# Patient Record
Sex: Female | Born: 1960 | Race: White | Hispanic: No | Marital: Married | Smoking: Former smoker
Health system: Southern US, Community
[De-identification: ages and names within clinical notes are randomized; demographics above are authoritative.]

## PROBLEM LIST (undated history)

## (undated) DIAGNOSIS — C801 Malignant (primary) neoplasm, unspecified: Secondary | ICD-10-CM

## (undated) DIAGNOSIS — M1990 Unspecified osteoarthritis, unspecified site: Secondary | ICD-10-CM

## (undated) DIAGNOSIS — N189 Chronic kidney disease, unspecified: Secondary | ICD-10-CM

## (undated) DIAGNOSIS — J302 Other seasonal allergic rhinitis: Secondary | ICD-10-CM

## (undated) DIAGNOSIS — E119 Type 2 diabetes mellitus without complications: Secondary | ICD-10-CM

## (undated) DIAGNOSIS — Z8489 Family history of other specified conditions: Secondary | ICD-10-CM

## (undated) DIAGNOSIS — E039 Hypothyroidism, unspecified: Secondary | ICD-10-CM

## (undated) DIAGNOSIS — Z9884 Bariatric surgery status: Secondary | ICD-10-CM

## (undated) HISTORY — PX: TOTAL THYROIDECTOMY: SHX2547

## (undated) HISTORY — PX: DIAGNOSTIC LAPAROSCOPY: SUR761

## (undated) HISTORY — PX: THUMB ARTHROSCOPY: SHX2509

## (undated) HISTORY — PX: CHOLECYSTECTOMY: SHX55

## (undated) HISTORY — PX: ROTATOR CUFF REPAIR: SHX139

---

## 2000-05-18 HISTORY — PX: GASTRIC BYPASS: SHX52

## 2004-07-17 ENCOUNTER — Other Ambulatory Visit: Payer: Self-pay

## 2004-07-17 ENCOUNTER — Emergency Department: Payer: Self-pay | Admitting: Unknown Physician Specialty

## 2004-07-23 ENCOUNTER — Ambulatory Visit: Payer: Self-pay | Admitting: Family Medicine

## 2004-08-07 ENCOUNTER — Ambulatory Visit: Payer: Self-pay | Admitting: General Surgery

## 2005-03-23 ENCOUNTER — Ambulatory Visit: Payer: Self-pay | Admitting: Otolaryngology

## 2005-03-26 ENCOUNTER — Ambulatory Visit: Payer: Self-pay | Admitting: Otolaryngology

## 2005-05-13 ENCOUNTER — Ambulatory Visit: Payer: Self-pay | Admitting: Otolaryngology

## 2005-06-24 ENCOUNTER — Ambulatory Visit: Payer: Self-pay | Admitting: Otolaryngology

## 2005-07-13 ENCOUNTER — Ambulatory Visit: Payer: Self-pay | Admitting: Otolaryngology

## 2005-07-17 ENCOUNTER — Ambulatory Visit: Payer: Self-pay | Admitting: Otolaryngology

## 2006-11-17 ENCOUNTER — Ambulatory Visit: Payer: Self-pay | Admitting: Orthopaedic Surgery

## 2007-05-10 ENCOUNTER — Encounter: Admission: RE | Admit: 2007-05-10 | Discharge: 2007-05-10 | Payer: Self-pay | Admitting: Gastroenterology

## 2007-08-30 ENCOUNTER — Encounter: Payer: Self-pay | Admitting: Family Medicine

## 2007-09-16 ENCOUNTER — Encounter: Payer: Self-pay | Admitting: Family Medicine

## 2007-10-17 ENCOUNTER — Encounter: Payer: Self-pay | Admitting: Family Medicine

## 2007-11-01 ENCOUNTER — Ambulatory Visit: Payer: Self-pay | Admitting: Urology

## 2008-01-04 ENCOUNTER — Ambulatory Visit: Payer: Self-pay | Admitting: General Surgery

## 2008-01-06 ENCOUNTER — Ambulatory Visit: Payer: Self-pay | Admitting: General Surgery

## 2008-01-26 ENCOUNTER — Other Ambulatory Visit: Payer: Self-pay

## 2008-01-26 ENCOUNTER — Emergency Department: Payer: Self-pay | Admitting: Emergency Medicine

## 2008-07-19 ENCOUNTER — Ambulatory Visit: Payer: Self-pay | Admitting: Family Medicine

## 2010-02-18 ENCOUNTER — Inpatient Hospital Stay: Payer: Self-pay | Admitting: Psychiatry

## 2010-12-29 ENCOUNTER — Ambulatory Visit: Payer: Self-pay | Admitting: Sports Medicine

## 2011-02-20 ENCOUNTER — Ambulatory Visit: Payer: Self-pay | Admitting: Orthopedic Surgery

## 2011-02-26 ENCOUNTER — Ambulatory Visit: Payer: Self-pay | Admitting: Orthopedic Surgery

## 2013-04-21 ENCOUNTER — Ambulatory Visit: Payer: Self-pay | Admitting: Specialist

## 2013-08-08 ENCOUNTER — Encounter: Payer: Self-pay | Admitting: Specialist

## 2013-08-16 ENCOUNTER — Encounter: Payer: Self-pay | Admitting: Specialist

## 2013-09-15 ENCOUNTER — Encounter: Payer: Self-pay | Admitting: Specialist

## 2014-09-07 NOTE — Op Note (Signed)
PATIENT NAME:  York, Megan MR#:  [id number] DATE OF BIRTH:  1960-11-13  DATE OF PROCEDURE:  04/21/2013  PREOPERATIVE DIAGNOSIS: Severe degenerative arthritis first metacarpocarpal joint, right thumb.   POSTOPERATIVE DIAGNOSIS: Severe degenerative arthritis first metacarpocarpal joint, right thumb.   PROCEDURE PERFORMED: Excisional trapezium arthroplasty, right thumb.   SURGEON: Christophe Louis, M.D.   ANESTHESIA: General.   COMPLICATIONS: None.   TOURNIQUET TIME: Approximately 60 minutes.   DESCRIPTION OF PROCEDURE: One gram of vancomycin was given intravenously prior to the procedure. General anesthesia is induced. The right upper extremity is thoroughly prepped with alcohol and ChloraPrep and draped in standard sterile fashion. Under loupe magnification, a standard dorsal incision is made and the dissection carried down to the dorsal capsule with reflection of cutaneous nerves and the tendons to the thumb and held with a spring retractor. Longitudinal incision is then made in the dorsal capsule and this is carefully reflected off the base of the first metacarpal and the trapezium for resuture at the end of the case. The trapezium is completely dissected out. It is then morselized using the saw and then completely removed using the rongeur. The wound is thoroughly irrigated multiple times. 0.5% plain Marcaine is used for a radial nerve block and also a median nerve block. Three small pieces of Gelfoam are then compressed into the shape of a trapezium and sewn together using 4-0 Mersilene. This is then secured to the base of the wound through the tendon at the base using the same 4-0 Mersilene suture. Dorsal capsule is then repaired securely using 4-0 Mersilene. One subcutaneous 5-0 Vicryl suture is placed and then a running subcuticular 3-0 Prolene. A soft bulky dressing with a thumb spica splint is applied. The tourniquet is released, and the patient is returned to the recovery room in  satisfactory condition having tolerated the procedure quite well. ____________________________ Lucas Mallow, MD ces:sb D: 04/21/2013 11:02:03 ET T: 04/21/2013 11:58:16 ET JOB#: [id number]  cc: Lucas Mallow, MD, <Dictator> Lucas Mallow MD ELECTRONICALLY SIGNED 04/24/2013 8:59

## 2015-10-24 ENCOUNTER — Encounter: Payer: Self-pay | Admitting: Physical Therapy

## 2015-10-24 ENCOUNTER — Ambulatory Visit: Payer: 59 | Attending: Orthopedic Surgery | Admitting: Physical Therapy

## 2015-10-24 DIAGNOSIS — M25612 Stiffness of left shoulder, not elsewhere classified: Secondary | ICD-10-CM | POA: Insufficient documentation

## 2015-10-24 DIAGNOSIS — M25512 Pain in left shoulder: Secondary | ICD-10-CM | POA: Insufficient documentation

## 2015-10-24 DIAGNOSIS — M6281 Muscle weakness (generalized): Secondary | ICD-10-CM | POA: Diagnosis present

## 2015-10-24 NOTE — Therapy (Signed)
[facility] [address] [place], [address] Phone: [phone]   Fax:  [phone]  Physical Therapy Evaluation  Patient Details  Name: Megan York MRN: [id number] Date of Birth: August 25, 1960 Referring Provider: Lauris Poag, MD  Encounter Date: 10/24/2015      PT End of Session - 10/24/15 1936    Visit Number 1   Number of Visits 12   Date for PT Re-Evaluation 12/05/15   PT Start Time 1815   PT Stop Time 1920   PT Time Calculation (min) 65 min   Activity Tolerance Patient tolerated treatment well;Patient limited by pain   Behavior During Therapy [facility] for tasks assessed/performed      History reviewed. No pertinent past medical history.  History reviewed. No pertinent past surgical history.  There were no vitals filed for this visit.       Subjective Assessment - 10/24/15 1818    Subjective Increased shoulder pain over lateral aspect of the shoulder onset ~2 month ago when pushing her grandchildren at the playground; states she had increased aggravation of pain during the movement and the next day. Patient reports worsening neck pain with onset of shoulder pain. Pain is aggravated when lifting arm, lowering arm from raised position, reaching, washing her hair, and putting arm behind her back. Alleviation of symptoms with medication and "shaking out" the shoulder.  Patient reports pain is aggravated with increased activity.    Pertinent History History of thyroid cancer (2007) resulting in thyroidectomy and remission; (2) previous rotator cuff surgeries bilaterally;    Patient Stated Goals Decrease shoulder pain and avoiding surgery.    Currently in Pain? Yes   Pain Score 2    Pain Location Shoulder   Pain Orientation Left   Pain Descriptors / Indicators Aching;Burning;Stabbing   Pain Type Acute pain   Pain Onset More than a month ago            [facility] PT Assessment - 10/24/15 1812     Assessment   Medical Diagnosis Impingement syndrome of shoulder, left (M75.42)   Referring Provider Lauris Poag, MD   Onset Date/Surgical Date 08/17/15   Hand Dominance Right   Next MD Visit unknown   Prior Therapy none   Precautions   Precautions None   Restrictions   Weight Bearing Restrictions No   Balance Screen   Has the patient fallen in the past 6 months No   Has the patient had a decrease in activity level because of a fear of falling?  No   Is the patient reluctant to leave their home because of a fear of falling?  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 Palpation: Increased tenderness and guarding along the upper trap, supraspinatus, infraspinatus, latissimus dorsi, and pec major.   Measurement:  Nerve Exam: Sensation: C3:Decreased sensation on L,  C4:Decreased sensation on L, C5: Decreased sensation on L, C6: WNL on both sides, C7:  WNL on both sides, C8: WNL on both sides, T1: WNL on both sides; MMT: C3: 5/5 , C4:4/5 -- increased pain, C5:3+/5 increased pain, C6:4/5 (elbow flexion with increased pain), C7:5/5, C8:5/5, T1:5/5; Reflexes: C5: WNL C6: WNL  Shoulder AROM/MMT: Flexion: 135 3+/5, ABD: 90 3+/5, ER:70 4/5 , IR 90 5/5, Behind Back: Ipslateral hip --increased pain, Behind Head: back of head --increased pain  Special Testing: (+): Hawkins kennedy  positive, Neer's, positive painful arc, cross arm adduction test, empty can (-): Full can, Spurlings  Outcome Measures: QuickDASH: 30% (self-perceived moderate shoulder dysfunction)  Therapeutic Exercise: Patient performed exercises with guidance, verbal and tactile cues and demonstration of therapist: Scapular retraction in sitting -- x10 Shoulder flexion in supine to forehead -- x10   Educated on proper postural alignment and to perform impingement precautions to not perform overhead motions, reaching across body, and abduction movements.  Manual Therapy:  Soft tissue mobilizations in supine along upper traps, pec major, and infraspinatus to decrease pain and spasms utilizing superficial techniques.  Modalities: Electrical stimulation: High volt clinical protocol applied (2) large electrodes over shoulder complex to decrease muscular pain and spasms with patient positioned in sitting. [place] stimulation with (2) large electrodes 10 sec on 10 sec off applied to scapular retractors to facilitate muscular activation simultaneously with high volt. Ice pack applied to shoulder in conjunction with electrical stimulation: no adverse reaction noted   Patient response to treatment: Decreased pain and spasms after performing modalities treatment indicating decreased muscular guarding. Decreased scapular retraction AROM which is improved after applying tactile cueing indicating decreased motor control.            PT Education - 10/24/15 1933    Education provided Yes   Education Details Educated on shoulder impingement precautions. Shoulder overhead AROM, scapular retractions   Person(s) Educated Patient   Methods Explanation;Demonstration   Comprehension Verbalized understanding;Returned demonstration             PT Long Term Goals - 10/24/15 1947    PT LONG TERM GOAL #1   Title Patient will demonstrate a QuickDASH score of under 20% by 12/05/15 to show significant improvement in  shoulder function and ability to raise her arm overhead   Baseline QuickDASH: 30%   Status New   PT LONG TERM GOAL #2   Title Patient will be able to perform shoulder flexion of 160 degrees without onset of symptoms by 12/05/15 to show sigificant improvement in shoulder function and ability to perform reaching activities.   Baseline Increased pain at 130 of shoulder flexion   Status New   PT LONG TERM GOAL #3   Title Patient will be independent with exercise performance and progression focused on improving shoulder strength and endurance by 12/05/15 to allow for further benefits of therapy after discharge from therapy   Baseline dependent for exercise performance and progression   Status New               Plan - 10/24/15 1937    Clinical Impression Statement Patient is a 55 yo right hand dominant female experiencing onset of left shoulder pain after playing with her grandchildren at the playground. Patient demonstrates shoulder dysfunction indicated by a decreased QuickDASH score of 30% (moderate self perceived shoulder dysfunction), Increased pain with shoulder flexion and  eccentric lowering her shoulder, and increased pain with reaching activities. Patient presents with possible shoulder impingement and supraspinatus involvement  as indicated by positive empty can test (with a negative full can test), positive hawkins-kennedy, neers, and cross arm  abduction test. Patient will benefit from further therapy intervention focused on decreasing pain/spasms and improving shoulder muscle endurance, coordination, and strength to return to prior level of function.     Rehab Potential Good   Clinical Impairments Affecting Rehab Potential (+) highly motivated, family support; (-) job requires use of UE, diabetes   PT Frequency 2x / week   PT Duration 6 weeks   PT Treatment/Interventions Cryotherapy;Electrical Stimulation;Iontophoresis 4mg /ml Dexamethasone;Moist Heat;Therapeutic exercise;Therapeutic  activities;Ultrasound;Neuromuscular re-education;Patient/family education;Manual techniques;Passive range of motion   PT Next Visit Plan Progress with performance of exercises in a pain-free range, scapular stabilization; pain control   PT Home Exercise Plan Overhead shoulder flexion, scapular retraction; observe shoulder impingement precautions   Consulted and Agree with Plan of Care Patient      Patient will benefit from skilled therapeutic intervention in order to improve the following deficits and impairments:  Decreased activity tolerance, Decreased mobility, Decreased strength, Increased edema, Impaired flexibility, Pain, Impaired UE functional use, Increased muscle spasms, Decreased endurance, Decreased range of motion  Visit Diagnosis: Stiffness of left shoulder, not elsewhere classified  Pain in left shoulder  Muscle weakness (generalized)     Problem List There are no active problems to display for this patient.   Blythe Stanford, SPT 10/25/2015, 12:00 PM  [place] [facility] [address], [place], [address] Phone: [phone]   Fax:  [phone]  Name: Megan York MRN: [id number] Date of Birth: 06-19-1960

## 2015-10-28 ENCOUNTER — Ambulatory Visit: Payer: 59 | Admitting: Physical Therapy

## 2015-10-29 ENCOUNTER — Encounter: Payer: Self-pay | Admitting: Physical Therapy

## 2015-10-29 ENCOUNTER — Ambulatory Visit: Payer: 59 | Admitting: Physical Therapy

## 2015-10-29 DIAGNOSIS — M25612 Stiffness of left shoulder, not elsewhere classified: Secondary | ICD-10-CM

## 2015-10-29 DIAGNOSIS — M6281 Muscle weakness (generalized): Secondary | ICD-10-CM

## 2015-10-29 DIAGNOSIS — M25512 Pain in left shoulder: Secondary | ICD-10-CM

## 2015-10-29 NOTE — Therapy (Signed)
[place] [facility] [address] [place], [address] Phone: [phone]   Fax:  [phone]  Physical Therapy Treatment  Patient Details  Name: Megan York MRN: [id number] Date of Birth: 1960-06-19 Referring Provider: Lauris Poag, MD  Encounter Date: 10/29/2015      PT End of Session - 10/29/15 1736    Visit Number 2   Number of Visits 12   Date for PT Re-Evaluation 12/05/15   PT Start Time [id number]   PT Stop Time 1826   PT Time Calculation (min) 53 min   Activity Tolerance Patient tolerated treatment well;Patient limited by pain   Behavior During Therapy [facility] for tasks assessed/performed      History reviewed. No pertinent past medical history.  History reviewed. No pertinent past surgical history.  There were no vitals filed for this visit.      Subjective Assessment - 10/29/15 1734    Subjective Patient reports she has been hurting a lot over the past few days due ot pain. Feels like a toothache. She has been trying to obey precautions however she uses her left arm a lot.    Patient Stated Goals Decrease shoulder pain and avoiding surgery.    Currently in Pain? Yes   Pain Score 4    Pain Location Shoulder   Pain Orientation Left   Pain Descriptors / Indicators Aching;Burning;Stabbing   Pain Type Acute pain   Pain Onset More than a month ago      Objective: AROM; left shoulder forward elevation + pain above shoulder level Palpation: left upper back, trapezius muscle: + tenderness, spasms    Treatment: Manual therapy: STM performed with superficial techniques left upper back, trapezius muscle, patient seated    Exercise:patient performed exercises with guidance, verbal and tactile cues and demonstration of PT: Supine lying: AAROM flexion left shoulder x 10, isometric flexion at 90 degrees, ER/IR in scapular plane 10 reps each rhythmic stabilization technique Supine lying: scapular  retraction with mild resistance x 10 reps Sitting: scapular retraction with tactile cues x 10 reps    Modalities: Iontophoresis: with dexamethasone 4mg /ml applied (2) electrodes to left shoulder, superior and anterior aspect 66ma-min (20 min. Following application)  Patient response to treatment: no adverse reaction to iontophoresis noted, patient reported less pain and improved flexibility in upper back following treatment of manual therapy/exercise. Improved ROM and ability to perform scapular retraction with repetition and cuing        PT Long Term Goals - 10/24/15 1947    PT LONG TERM GOAL #1   Title Patient will demonstrate a QuickDASH score of under 20% by 12/05/15 to show significant improvement in shoulder function and ability to raise her arm overhead   Baseline QuickDASH: 30%   Status New   PT LONG TERM GOAL #2   Title Patient will be able to perform shoulder flexion of 160 degrees without onset of symptoms by 12/05/15 to show sigificant improvement in shoulder function and ability to perform reaching activities.   Baseline Increased pain at 130 of shoulder flexion   Status New   PT LONG TERM GOAL #3   Title Patient will be independent with exercise performance and progression focused on improving shoulder strength and endurance by 12/05/15 to allow for further benefits of therapy after discharge from therapy   Baseline dependent for exercise performance and progression   Status New  Plan - 10/29/15 1839    Clinical Impression Statement Patient demonstrated decresaed pain in left shoulder and decreased spasms following treatment today. She continues with pain as primary limitaiton for normal function without difficulty using left UE and will therefore benefit from continue physical therapy intervention to address limitaitons and achieve goals.    Rehab Potential Good   PT Frequency 2x / week   PT Duration 6 weeks   PT Treatment/Interventions  Cryotherapy;Electrical Stimulation;Iontophoresis 4mg /ml Dexamethasone;Moist Heat;Therapeutic exercise;Therapeutic activities;Ultrasound;Neuromuscular re-education;Patient/family education;Manual techniques;Passive range of motion   PT Next Visit Plan Progress with performance of exercises in a pain-free range, scapular stabilization; iontophoresis with dexamethasone 4mg /ml   PT Home Exercise Plan Overhead shoulder flexion, scapular retraction      Patient will benefit from skilled therapeutic intervention in order to improve the following deficits and impairments:  Decreased activity tolerance, Decreased mobility, Decreased strength, Increased edema, Impaired flexibility, Pain, Impaired UE functional use, Increased muscle spasms, Decreased endurance, Decreased range of motion  Visit Diagnosis: Stiffness of left shoulder, not elsewhere classified  Pain in left shoulder  Muscle weakness (generalized)     Problem List There are no active problems to display for this patient.   Jomarie Longs PT 10/30/2015, 5:21 PM  [place] [facility] [address] [place], [address] Phone: [phone]   Fax:  [phone]  Name: Ainsleigh Drohan MRN: [id number] Date of Birth: Jun 21, 1960

## 2015-10-30 ENCOUNTER — Encounter: Payer: 59 | Admitting: Physical Therapy

## 2015-10-31 ENCOUNTER — Encounter: Payer: Self-pay | Admitting: Physical Therapy

## 2015-10-31 ENCOUNTER — Ambulatory Visit: Payer: 59 | Admitting: Physical Therapy

## 2015-10-31 DIAGNOSIS — M25612 Stiffness of left shoulder, not elsewhere classified: Secondary | ICD-10-CM | POA: Diagnosis not present

## 2015-10-31 DIAGNOSIS — M6281 Muscle weakness (generalized): Secondary | ICD-10-CM

## 2015-10-31 DIAGNOSIS — M25512 Pain in left shoulder: Secondary | ICD-10-CM

## 2015-10-31 NOTE — Therapy (Signed)
[place] [facility] [address], [place], [address] Phone: [phone]   Fax:  [phone]  Physical Therapy Treatment  Patient Details  Name: Megan York MRN: [id number] Date of Birth: December 20, 1960 Referring Provider: Lauris Poag, MD  Encounter Date: 10/31/2015      PT End of Session - 10/31/15 1859    Visit Number 3   Number of Visits 12   Date for PT Re-Evaluation 12/05/15   PT Start Time 1850   PT Stop Time 1945   PT Time Calculation (min) 55 min   Activity Tolerance Patient tolerated treatment well;Patient limited by pain   Behavior During Therapy [facility] for tasks assessed/performed      History reviewed. No pertinent past medical history.  History reviewed. No pertinent past surgical history.  There were no vitals filed for this visit.      Subjective Assessment - 10/31/15 1854    Subjective Patient reports no adverse reactions noted to previous treatment of iontophoresis. She has been trying to observe impingement precautions and is noticing minimal, if any improvement at this time.    Limitations Lifting;House hold activities;Other (comment)  work related activites: reaching, lifting   Patient Stated Goals Decrease shoulder pain and avoiding surgery.    Currently in Pain? Yes   Pain Score 1    Pain Location Shoulder   Pain Orientation Left   Pain Descriptors / Indicators Aching;Tightness  intermittent sharp pain in shoulder with using left UE   Pain Type Acute pain   Pain Onset More than a month ago  09/2015   Pain Frequency Other (Comment)  aching constant/ sharp pain intermitently   Aggravating Factors  reaching, lifting, crossing arm in front of her   Pain Relieving Factors rest, ice   Effect of Pain on Daily Activities unable to perform activties without pain/difficulty        Objective: AROM; left shoulder forward elevation + pain above shoulder level Palpation: left  upper back, trapezius muscle: + tenderness, spasms (significantly decreased from previous session), mild tenderness superior aspect of left shoulder over [facility] joint   Treatment:   Exercise:patient performed exercises with guidance, verbal and tactile cues and demonstration of PT: Standing at door: Scapular retraction x 10 reps 2 second holds Isometric shoulder flexion/ER/IR with towel with cuing for correct positioning of shoulder/trunk and LE's to perform with correct muscle activation and alignment 5 reps x 5 second holds Instructed in scapular control with shoulder back and down using UE ranger (on floor with patient standing) for shoulder flexion x 1 min. And rotations with towel under arm x 1 min. (compared to right UE for patient to feel the difference in motor control/strength)  Modalities: Iontophoresis: with dexamethasone 4mg /ml applied (2) electrodes to left shoulder, superior and anterior aspect 16ma-min (20 min. Following application)  Patient response to treatment: no adverse reaction to iontophoresis noted, patient reported less pain and improved flexibility in left shoulder.  Improved ROM and ability to perform scapular retraction with repetition and cuing using UE ranger        PT Education - 10/31/15 2015    Education provided Yes   Education Details HEP: added isometric exercises for left shoulder, instructed in correct motor control for scapula during all exercises   Person(s) Educated Patient   Methods Explanation;Demonstration;Tactile cues;Verbal cues;Handout   Comprehension Verbalized understanding;Returned demonstration;Verbal cues required             PT Long Term Goals -  10/24/15 1947    PT LONG TERM GOAL #1   Title Patient will demonstrate a QuickDASH score of under 20% by 12/05/15 to show significant improvement in shoulder function and ability to raise her arm overhead   Baseline QuickDASH: 30%   Status New   PT LONG TERM GOAL #2   Title Patient will  be able to perform shoulder flexion of 160 degrees without onset of symptoms by 12/05/15 to show sigificant improvement in shoulder function and ability to perform reaching activities.   Baseline Increased pain at 130 of shoulder flexion   Status New   PT LONG TERM GOAL #3   Title Patient will be independent with exercise performance and progression focused on improving shoulder strength and endurance by 12/05/15 to allow for further benefits of therapy after discharge from therapy   Baseline dependent for exercise performance and progression   Status New               Plan - 10/31/15 2000    Clinical Impression Statement Patient demonstrated good carry over with iontophoresis with decreased pain from 4/10 to 1/10. She continues with pain in left shoulder and decreased motor control in periscapular muscles which will benefit from additional physical therapy intervention.     Rehab Potential Good   PT Frequency 2x / week   PT Duration 6 weeks   PT Treatment/Interventions Cryotherapy;Electrical Stimulation;Iontophoresis 4mg /ml Dexamethasone;Moist Heat;Therapeutic exercise;Therapeutic activities;Ultrasound;Neuromuscular re-education;Patient/family education;Manual techniques;Passive range of motion   PT Next Visit Plan Progress with performance of exercises in a pain-free range, scapular stabilization; iontophoresis with dexamethasone 4mg /ml   PT Home Exercise Plan isometric shoulder flexion and rotations at doorway, scapular control/retraction in doorway      Patient will benefit from skilled therapeutic intervention in order to improve the following deficits and impairments:  Decreased activity tolerance, Decreased mobility, Decreased strength, Increased edema, Impaired flexibility, Pain, Impaired UE functional use, Increased muscle spasms, Decreased endurance, Decreased range of motion  Visit Diagnosis: Stiffness of left shoulder, not elsewhere classified  Pain in left  shoulder  Muscle weakness (generalized)     Problem List There are no active problems to display for this patient.   Jomarie Longs PT 10/31/2015, 8:16 PM  [place] [facility] [address] [place], [address] Phone: [phone]   Fax:  [phone]  Name: Dannaly Arai MRN: [id number] Date of Birth: July 28, 1960

## 2015-11-05 ENCOUNTER — Ambulatory Visit: Payer: 59 | Admitting: Physical Therapy

## 2015-11-05 ENCOUNTER — Encounter: Payer: Self-pay | Admitting: Physical Therapy

## 2015-11-05 DIAGNOSIS — M25512 Pain in left shoulder: Secondary | ICD-10-CM

## 2015-11-05 DIAGNOSIS — M25612 Stiffness of left shoulder, not elsewhere classified: Secondary | ICD-10-CM | POA: Diagnosis not present

## 2015-11-05 DIAGNOSIS — M6281 Muscle weakness (generalized): Secondary | ICD-10-CM

## 2015-11-05 NOTE — Therapy (Signed)
[facility] [address] [place], [address] Phone: [phone]   Fax:  [phone]  Physical Therapy Treatment  Patient Details  Name: Megan York MRN: [id number] Date of Birth: June 01, 1960 Referring Provider: Lauris Poag, MD  Encounter Date: 11/05/2015      PT End of Session - 11/05/15 1739    Visit Number 4   Number of Visits 12   Date for PT Re-Evaluation 12/05/15   PT Start Time 1801   PT Stop Time 1856   PT Time Calculation (min) 55 min   Activity Tolerance Patient tolerated treatment well;Patient limited by pain   Behavior During Therapy [facility] for tasks assessed/performed      History reviewed. No pertinent past medical history.  History reviewed. No pertinent past surgical history.  There were no vitals filed for this visit.      Subjective Assessment - 11/05/15 1705    Subjective Patient reports increased weakness and pain during and after exercise performance. Patient states symptoms are minorly improved after the iontophoresis treatment the previous visit.   Limitations Lifting;House hold activities;Other (comment)  work related activites: reaching, lifting   Patient Stated Goals Decrease shoulder pain and avoiding surgery.    Currently in Pain? Yes   Pain Score 1    Pain Location Shoulder   Pain Orientation Left   Pain Descriptors / Indicators Aching;Tightness   Pain Type Acute pain   Pain Onset More than a month ago  09/2015        Objective: AROM; left shoulder forward elevation + pain above shoulder level Palpation: left upper back, trapezius muscle: + tenderness, spasms (significantly decreased from previous session), increased spasms along external shoulder rotators  Special Tests: Shoulder AROM: 160 with pain at 100 deg [place], neers, drop arm, empty can,cross arm abduction test -- (+)  Treatment:  Exercise: patient performed exercises with  guidance, verbal and tactile cues and demonstration of PT:  Seated: With back to pulley: Shoulder AAROM with pulley over doorway-- 3 min  Standing at door: Standing pulley arm rotations -- 3 min Scapular retraction x 10 reps 2 second holds Isometric shoulder flexion/ER/IR with towel with cuing for correct positioning of shoulder/trunk and LE's to perform with correct muscle activation and alignment -- 3 reps x 5 second holds   Modalities: Iontophoresis: with dexamethasone 4mg /ml applied (2) electrodes to left shoulder, posterior and anterior aspect 39ma-min (25 min. Following application)  Patient response to treatment: no adverse reaction to iontophoresis noted, patient reported less  in left irritation in left shoulder. Good demonstration of pulley exercise requiring minimal cueing to decrease shoulder activation. Increased pain with isometric ER at doorway therefore modified with decreasing repetition from 5 to 3 reps         PT Education - 11/05/15 1740    Education provided Yes   Education Details HEP: Pulleys in sitting and in standing   Person(s) Educated Patient   Methods Explanation;Demonstration   Comprehension Verbalized understanding;Returned demonstration             PT Long Term Goals - 10/24/15 1947    PT LONG TERM GOAL #1   Title Patient will demonstrate a QuickDASH score of under 20% by 12/05/15 to show significant improvement in shoulder function and ability to raise her arm overhead   Baseline QuickDASH: 30%   Status New   PT LONG TERM GOAL #2   Title Patient will be able to perform shoulder flexion  of 160 degrees without onset of symptoms by 12/05/15 to show sigificant improvement in shoulder function and ability to perform reaching activities.   Baseline Increased pain at 130 of shoulder flexion   Status New   PT LONG TERM GOAL #3   Title Patient will be independent with exercise performance and progression focused on improving shoulder strength and  endurance by 12/05/15 to allow for further benefits of therapy after discharge from therapy   Baseline dependent for exercise performance and progression   Status New               Plan - 11/05/15 1731    Clinical Impression Statement Patient demonstrated improvement in symptoms following iontophoresis the previous treatment indicating functional carryover between vists. Increased shoulder fatigue and weakness with shoulder exercises due to pain, decreased muscular strength within the shoulder ERs and supraspinatus. Patient will benefit from further skilled therapy focused on pain control and  improving strength to return to prior level of function.    Rehab Potential Good   PT Frequency 2x / week   PT Duration 6 weeks   PT Treatment/Interventions Cryotherapy;Electrical Stimulation;Iontophoresis 4mg /ml Dexamethasone;Moist Heat;Therapeutic exercise;Therapeutic activities;Ultrasound;Neuromuscular re-education;Patient/family education;Manual techniques;Passive range of motion   PT Next Visit Plan Progress with performance of exercises in a pain-free range, scapular stabilization; iontophoresis with dexamethasone 4mg /ml   PT Home Exercise Plan isometric shoulder flexion and rotations at doorway, scapular control/retraction in doorway      Patient will benefit from skilled therapeutic intervention in order to improve the following deficits and impairments:  Decreased activity tolerance, Decreased mobility, Decreased strength, Increased edema, Impaired flexibility, Pain, Impaired UE functional use, Increased muscle spasms, Decreased endurance, Decreased range of motion  Visit Diagnosis: Stiffness of left shoulder, not elsewhere classified  Muscle weakness (generalized)  Pain in left shoulder     Problem List There are no active problems to display for this patient.   Blythe Stanford, SPT 11/06/2015, 10:39 AM  [facility] [address], [place], [address] Phone: [phone]   Fax:  [phone]  Name: Megan York MRN: [id number] Date of Birth: 14-May-1961

## 2015-11-07 ENCOUNTER — Ambulatory Visit: Payer: 59 | Admitting: Physical Therapy

## 2015-11-11 ENCOUNTER — Encounter: Payer: Self-pay | Admitting: Physical Therapy

## 2015-11-11 ENCOUNTER — Ambulatory Visit: Payer: 59 | Admitting: Physical Therapy

## 2015-11-11 DIAGNOSIS — M25612 Stiffness of left shoulder, not elsewhere classified: Secondary | ICD-10-CM

## 2015-11-11 DIAGNOSIS — M6281 Muscle weakness (generalized): Secondary | ICD-10-CM

## 2015-11-11 DIAGNOSIS — M25512 Pain in left shoulder: Secondary | ICD-10-CM

## 2015-11-12 NOTE — Therapy (Signed)
[facility] [address], [place], [address] Phone: [phone]   Fax:  [phone]  Physical Therapy Treatment  Patient Details  Name: Megan York MRN: [id number] Date of Birth: [birth year] Referring Provider: Lauris Poag, MD  Encounter Date: 11/11/2015      PT End of Session - 11/11/15 1915    Visit Number 5   Number of Visits 12   Date for PT Re-Evaluation 12/05/15   PT Start Time 1828   PT Stop Time 1923   PT Time Calculation (min) 55 min   Activity Tolerance Patient tolerated treatment well;Patient limited by pain   Behavior During Therapy [facility] for tasks assessed/performed      History reviewed. No pertinent past medical history.  History reviewed. No pertinent past surgical history.  There were no vitals filed for this visit.      Subjective Assessment - 11/11/15 1830    Subjective Patient reports the stiffness left shoulder has decreased but she conitnues with catching in her shoulder. She has not been exercising regularly due to stomach/intestinal upset over the past week.   Limitations Lifting;House hold activities;Other (comment)  working, lifing and work related activities   Patient Stated Goals Decrease shoulder pain and avoiding surgery.    Currently in Pain? Yes   Pain Location Shoulder      Objective:  AROM; left shoulder forward elevation WNL with mild pain end range Palpation: left upper back, trapezius muscle: + tenderness, spasms (significantly decreased from previous session), increased spasms along external shoulder rotators with decreased tenderness as compared to previous session  Treatment:  Exercise: patient performed exercises with guidance, verbal and tactile cues and demonstration of PT: Standing at wall: Bilateral shoulder ER and scaption x 10 reps each At OMEGA: Seated scapular retraction 10# bilateral x 15 reps, single arm 5# x 10 reps  each Standing straight arm pull downs 15# x 15 reps  Modalities: Iontophoresis: with dexamethasone 4mg /ml applied (2) electrodes to left shoulder, posterior and anterior aspect 89ma-min (25 min. Following application);patient seated in chair  Patient response to treatment: no adverse reaction to iontophoresis noted, patient reported less irritation in left shoulder. Good demonstration of new exercises following demonstration, requiring minimal cueing to decrease upper trapezius activation.    Patient education: Instructed in ER and scaption at wall, green thera tubing for scapular retraction and pull down to thighs with handout given; patient returned demonstration with good understanding of home program      PT Long Term Goals - 10/24/15 1947    PT LONG TERM GOAL #1   Title Patient will demonstrate a QuickDASH score of under 20% by 12/05/15 to show significant improvement in shoulder function and ability to raise her arm overhead   Baseline QuickDASH: 30%   Status New   PT LONG TERM GOAL #2   Title Patient will be able to perform shoulder flexion of 160 degrees without onset of symptoms by 12/05/15 to show sigificant improvement in shoulder function and ability to perform reaching activities.   Baseline Increased pain at 130 of shoulder flexion   Status New   PT LONG TERM GOAL #3   Title Patient will be independent with exercise performance and progression focused on improving shoulder strength and endurance by 12/05/15 to allow for further benefits of therapy after discharge from therapy   Baseline dependent for exercise performance and progression   Status New  Plan - 11/11/15 1934    Clinical Impression Statement Patient is improving with decreasing pain in front of left shoulder, less tender posterior shoulder with iontophoresis and able to perform exercises without exacerbation of symptoms. She continues to benefit from physical therapy for pain control,  progressive strengthening and guidance to return to prior level of function.    Rehab Potential Good   PT Frequency 2x / week   PT Duration 6 weeks   PT Treatment/Interventions Cryotherapy;Electrical Stimulation;Iontophoresis 4mg /ml Dexamethasone;Moist Heat;Therapeutic exercise;Therapeutic activities;Ultrasound;Neuromuscular re-education;Patient/family education;Manual techniques;Passive range of motion   PT Next Visit Plan Progress with performance of exercises in a pain-free range, scapular stabilization; iontophoresis with dexamethasone 4mg /ml   PT Home Exercise Plan added scapular retraction with resistive band, lat pull downs with band, scaption and ER at wall      Patient will benefit from skilled therapeutic intervention in order to improve the following deficits and impairments:  Decreased activity tolerance, Decreased mobility, Decreased strength, Increased edema, Impaired flexibility, Pain, Impaired UE functional use, Increased muscle spasms, Decreased endurance, Decreased range of motion  Visit Diagnosis: Stiffness of left shoulder, not elsewhere classified  Muscle weakness (generalized)  Pain in left shoulder     Problem List There are no active problems to display for this patient.   Jomarie Longs PT 11/12/2015, 9:58 AM  [facility] [address] [place], [address] Phone: [phone]   Fax:  [phone]  Name: Megan York MRN: [id number] Date of Birth: February 10, 1961

## 2015-11-13 ENCOUNTER — Encounter: Payer: Self-pay | Admitting: Physical Therapy

## 2015-11-13 ENCOUNTER — Ambulatory Visit: Payer: 59 | Admitting: Physical Therapy

## 2015-11-13 DIAGNOSIS — M25612 Stiffness of left shoulder, not elsewhere classified: Secondary | ICD-10-CM

## 2015-11-13 DIAGNOSIS — M6281 Muscle weakness (generalized): Secondary | ICD-10-CM

## 2015-11-13 DIAGNOSIS — M25512 Pain in left shoulder: Secondary | ICD-10-CM

## 2015-11-13 NOTE — Therapy (Signed)
[facility] [address], [place], [address] Phone: [phone]   Fax:  [phone]  Physical Therapy Treatment  Patient Details  Name: Megan York MRN: [id number] Date of Birth: [birth year] Referring Provider: Lauris Poag, MD  Encounter Date: 11/13/2015      PT End of Session - 11/13/15 1925    Visit Number 6   Number of Visits 12   Date for PT Re-Evaluation 12/05/15   PT Start Time [id number]   PT Stop Time 1915   PT Time Calculation (min) 45 min   Activity Tolerance Patient tolerated treatment well;Patient limited by pain   Behavior During Therapy [facility] for tasks assessed/performed      History reviewed. No pertinent past medical history.  History reviewed. No pertinent past surgical history.  There were no vitals filed for this visit.      Subjective Assessment - 11/13/15 1835    Subjective Patient reports increased pain in the left shoulder and states increased soreness in the shoulder joint. Reports increased pain when performing HEP and states concern with continuing therapy program secondary to cost. Patient reports she didn't feel well after iontophoresis treatment last visit and wishes to discontinue iontophoresis to see if symptoms resolve.   Limitations Lifting;House hold activities;Other (comment)  working, lifing and work related activities   Patient Stated Goals Decrease shoulder pain and avoiding surgery.    Currently in Pain? Yes   Pain Score 4    Pain Location Shoulder   Pain Orientation Left   Pain Descriptors / Indicators Aching   Pain Type Acute pain   Pain Onset More than a month ago      Objective:  AROM; left shoulder forward elevation WNL with mild pain end range Palpation: left upper back, trapezius muscle: + tenderness and increased spasms along external shoulder rotators  Treatment:  Exercise: patient performed exercises with guidance, verbal and tactile  cues and demonstration of PT:  Standing at wall: Bilateral shoulder ER and scaption -- x 5 reps each Isometrics in standing shoulder IR/ER -- x5; 3sec holds Scapular retraction in standing against door frame -- x10 Straight arm shoulder ext -- x 10 Scapular retraction with yellow band -- x5 with yellow band  Education: Educated on appropriate pain response, plan of care, and technique with exercises at home Modalities: High Volt: 2 large electrodes: applied to superior/inferior aspect of shoulder to decrease muscle spasms and pain; Russian stim: 2 large electrodes applied medially to medial border of the scapula to improve muscular activation with patient positioned in sitting for 15 min with ice applied over the left shoulder  Patient response to treatment:  Decreased pain by 33% after performing modalities treatment indicating decreased muscle spasms and guarding. Good demonstration of scapular retractions in the doorway requiring minimal tactile cueing to activate the appropriate musculature.          PT Education - 11/13/15 1926    Education provided Yes   Education Details Reviewed HEP; gave patient yellow resistance band for exercises to decrease pain with performance   Person(s) Educated Patient   Methods Explanation;Demonstration   Comprehension Verbalized understanding;Returned demonstration             PT Long Term Goals - 10/24/15 1947    PT LONG TERM GOAL #1   Title Patient will demonstrate a QuickDASH score of under 20% by 12/05/15 to show significant improvement in shoulder function and ability to raise her arm overhead  Baseline QuickDASH: 30%   Status New   PT LONG TERM GOAL #2   Title Patient will be able to perform shoulder flexion of 160 degrees without onset of symptoms by 12/05/15 to show sigificant improvement in shoulder function and ability to perform reaching activities.   Baseline Increased pain at 130 of shoulder flexion   Status New   PT LONG  TERM GOAL #3   Title Patient will be independent with exercise performance and progression focused on improving shoulder strength and endurance by 12/05/15 to allow for further benefits of therapy after discharge from therapy   Baseline dependent for exercise performance and progression   Status New               Plan - 11/13/15 1921    Clinical Impression Statement Focused today's treatment on decreasing pain today as it was elevated compared to previous visits. Educated patient on appropriate technique for home exercise program focusing on changing resistance to decrease pain and performing exercises with correct posture. Patient demonstrates decreased shoulder pain after performing modalities treatment indicating decreased muscle spasms/guarding and patient will benefit from further skilled therapy to return to prior level of function. Patient mentions decreasing therapy frequency secondary to finincial reasons; will modify treatment to focus on pain control and pt will continue with independent HEP.    Rehab Potential Good   PT Frequency 2x / week   PT Duration 6 weeks   PT Treatment/Interventions Cryotherapy;Electrical Stimulation;Iontophoresis 4mg /ml Dexamethasone;Moist Heat;Therapeutic exercise;Therapeutic activities;Ultrasound;Neuromuscular re-education;Patient/family education;Manual techniques;Passive range of motion   PT Next Visit Plan Progress with performance of exercises in a pain-free range, scapular stabilization; iontophoresis with dexamethasone 4mg /ml   PT Home Exercise Plan added scapular retraction with resistive band, lat pull downs with band, scaption and ER at wall      Patient will benefit from skilled therapeutic intervention in order to improve the following deficits and impairments:  Decreased activity tolerance, Decreased mobility, Decreased strength, Increased edema, Impaired flexibility, Pain, Impaired UE functional use, Increased muscle spasms, Decreased  endurance, Decreased range of motion  Visit Diagnosis: Stiffness of left shoulder, not elsewhere classified  Muscle weakness (generalized)  Pain in left shoulder     Problem List There are no active problems to display for this patient.   Blythe Stanford, SPT 11/13/2015, 7:26 PM  [facility] [address], [place], [address] Phone: [phone]   Fax:  [phone]  Name: Cherrish Deitering MRN: [id number] Date of Birth: June 22, 1960

## 2015-11-20 ENCOUNTER — Ambulatory Visit: Payer: 59 | Attending: Orthopedic Surgery | Admitting: Physical Therapy

## 2015-11-20 ENCOUNTER — Encounter: Payer: Self-pay | Admitting: Physical Therapy

## 2015-11-20 DIAGNOSIS — M25612 Stiffness of left shoulder, not elsewhere classified: Secondary | ICD-10-CM | POA: Insufficient documentation

## 2015-11-20 DIAGNOSIS — M25512 Pain in left shoulder: Secondary | ICD-10-CM | POA: Insufficient documentation

## 2015-11-20 DIAGNOSIS — M6281 Muscle weakness (generalized): Secondary | ICD-10-CM | POA: Insufficient documentation

## 2015-11-21 NOTE — Therapy (Signed)
[facility] [address], [place], [address] Phone: [phone]   Fax:  [phone]  Physical Therapy Treatment  Patient Details  Name: Megan York MRN: [id number] Date of Birth: 16-Jul-1960 Referring Provider: Lauris Poag, MD  Encounter Date: 11/20/2015      PT End of Session - 11/20/15 1955    Visit Number 7   Number of Visits 12   Date for PT Re-Evaluation 12/05/15   PT Start Time [id number]   PT Stop Time 1835   PT Time Calculation (min) 25 min   Activity Tolerance Patient tolerated treatment well   Behavior During Therapy [facility] for tasks assessed/performed      History reviewed. No pertinent past medical history.  History reviewed. No pertinent past surgical history.  There were no vitals filed for this visit.      Subjective Assessment - 11/20/15 1810    Subjective Patient reports decreased pain and soreness in the left shoulder joint. She is now able to do more at home using left UE with less difficulty and pain noted in shoulder. She feels the electrical stimulation treatment did help and she is exercising independently at home with home program as instructed. She agrees to continuing for electrical stimulation and will perform HEP independently at home.    Limitations Lifting;House hold activities;Other (comment)  working, lifting at work   Patient Stated Goals Decrease shoulder pain and avoiding surgery.    Currently in Pain? Yes   Pain Score 6    Pain Location Shoulder   Pain Orientation Left   Pain Descriptors / Indicators Aching   Pain Type Acute pain   Pain Onset More than a month ago      Objective:  AROM; left shoulder forward elevation WNL's with mild soreness and less difficulty than previous session Palpation: left upper back, trapezius muscle: + tenderness and increased spasms; left shoulder lateral aspect with + tenderness   Treatment:   Modalities: High Volt:  2 large electrodes: applied to superior/inferior aspect of shoulder to decrease muscle spasms and pain; Russian stim: 2 large electrodes applied medially to medial border of the scapula/lower trapezius muscle to improve muscular activation with patient positioned in sitting for 20 min with ice applied over the left shoulder  Patient response to treatment:  Decreased pain by 50% after performing modalities treatment indicating decreased muscle spasms and guarding. Good verbal understanding of home exercises    Education: Educated on appropriate pain response, plan of care, and technique with exercises at home       PT Long Term Goals - 10/24/15 1947    PT LONG TERM GOAL #1   Title Patient will demonstrate a QuickDASH score of under 20% by 12/05/15 to show significant improvement in shoulder function and ability to raise her arm overhead   Baseline QuickDASH: 30%   Status New   PT LONG TERM GOAL #2   Title Patient will be able to perform shoulder flexion of 160 degrees without onset of symptoms by 12/05/15 to show sigificant improvement in shoulder function and ability to perform reaching activities.   Baseline Increased pain at 130 of shoulder flexion   Status New   PT LONG TERM GOAL #3   Title Patient will be independent with exercise performance and progression focused on improving shoulder strength and endurance by 12/05/15 to allow for further benefits of therapy after discharge from therapy   Baseline dependent for exercise performance and progression  Status New               Plan - 11/20/15 1840    Clinical Impression Statement Patient is responding favorably to electrical stimulation for decreasing muscle spasms, pain and improving motor control with periscapular muscles which allow her to perform exercises at home and she is progressing with improved funcitonal use of left shoulder for household chores with less difficulty.    Rehab Potential Good   PT Frequency 2x / week    PT Duration 6 weeks   PT Treatment/Interventions Cryotherapy;Electrical Stimulation;Iontophoresis 4mg /ml Dexamethasone;Moist Heat;Therapeutic exercise;Therapeutic activities;Ultrasound;Neuromuscular re-education;Patient/family education;Manual techniques;Passive range of motion   PT Next Visit Plan electrical stimulation; monitor home exercises   PT Home Exercise Plan continue with home exercises for strength and ROM      Patient will benefit from skilled therapeutic intervention in order to improve the following deficits and impairments:  Decreased activity tolerance, Decreased mobility, Decreased strength, Increased edema, Impaired flexibility, Pain, Impaired UE functional use, Increased muscle spasms, Decreased endurance, Decreased range of motion  Visit Diagnosis: Stiffness of left shoulder, not elsewhere classified  Muscle weakness (generalized)  Pain in left shoulder     Problem List There are no active problems to display for this patient.   Jomarie Longs PT 11/21/2015, 11:53 AM  [facility] [address], [place], [address] Phone: [phone]   Fax:  [phone]  Name: Megan York MRN: [id number] Date of Birth: [birth year]

## 2015-11-26 ENCOUNTER — Encounter: Payer: Self-pay | Admitting: Physical Therapy

## 2015-11-26 ENCOUNTER — Ambulatory Visit: Payer: 59 | Admitting: Physical Therapy

## 2015-11-26 DIAGNOSIS — M25612 Stiffness of left shoulder, not elsewhere classified: Secondary | ICD-10-CM | POA: Diagnosis not present

## 2015-11-26 DIAGNOSIS — M6281 Muscle weakness (generalized): Secondary | ICD-10-CM

## 2015-11-26 DIAGNOSIS — M25512 Pain in left shoulder: Secondary | ICD-10-CM

## 2015-11-26 NOTE — Therapy (Signed)
[facility] [address] [place], [address] Phone: [phone]   Fax:  [phone]  Physical Therapy Treatment  Patient Details  Name: Megan York MRN: [id number] Date of Birth: 01/19/1961 Referring Provider: Lauris Poag, MD  Encounter Date: 11/26/2015      PT End of Session - 11/26/15 1819    Visit Number 8   Number of Visits 12   Date for PT Re-Evaluation 12/05/15   PT Start Time [id number]   PT Stop Time 1813   PT Time Calculation (min) 28 min   Activity Tolerance Patient tolerated treatment well   Behavior During Therapy [facility] for tasks assessed/performed      History reviewed. No pertinent past medical history.  History reviewed. No pertinent past surgical history.  There were no vitals filed for this visit.      Subjective Assessment - 11/26/15 1750    Subjective Patient reports she is progressing with less pain in her left shoulder with current treament and would like to continue for just the electrical stimulation to keep costs down. She is at least 50% or better since beginning physical and is exercising at home as instructed and not lifting or doing anything to aggravate her pain.   Limitations Lifting;House hold activities;Other (comment)  lifting, work related activties using left UE   Patient Stated Goals Decrease shoulder pain and avoiding surgery.    Currently in Pain? Yes   Pain Score 1    Pain Location Shoulder   Pain Orientation Left   Pain Descriptors / Indicators Aching   Pain Type Acute pain   Pain Onset More than a month ago   Pain Frequency Intermittent      Objective:  AROM; left shoulder forward elevation WNL's with mild soreness and less difficulty than previous session Palpation: left upper back, trapezius muscle: + tenderness and increased spasms; left shoulder lateral aspect with + tenderness   Treatment:   Modalities: Electrical stimulation:   Left shoulder:  High Volt: 2 large electrodes: applied to superior/inferior posterior aspect of shoulder to decrease muscle spasms and pain; Russian stim: 2 large electrodes applied   to medial border of the scapula/lower trapezius muscle to improve muscular activation with patient positioned in sitting for 20 min with ice applied over the left shoulder  Patient response to treatment:  Decreased pain by 50% after performing modalities treatment indicating decreased muscle spasms and guarding. Good verbal understanding of home exercises         PT Long Term Goals - 10/24/15 1947    PT LONG TERM GOAL #1   Title Patient will demonstrate a QuickDASH score of under 20% by 12/05/15 to show significant improvement in shoulder function and ability to raise her arm overhead   Baseline QuickDASH: 30%   Status New   PT LONG TERM GOAL #2   Title Patient will be able to perform shoulder flexion of 160 degrees without onset of symptoms by 12/05/15 to show sigificant improvement in shoulder function and ability to perform reaching activities.   Baseline Increased pain at 130 of shoulder flexion   Status New   PT LONG TERM GOAL #3   Title Patient will be independent with exercise performance and progression focused on improving shoulder strength and endurance by 12/05/15 to allow for further benefits of therapy after discharge from therapy   Baseline dependent for exercise performance and progression   Status New  Plan - 11/26/15 1821    Clinical Impression Statement Patient continues to progress with decreased pain and improving functional use of left UE with significant decreased pain of >50%. She is responding well to electrical stimulation and home exercises and will therefore continue through next week  with anticipated discharge to self management.    Rehab Potential Good   PT Frequency 2x / week   PT Duration 6 weeks   PT Treatment/Interventions Cryotherapy;Electrical  Stimulation;Iontophoresis 4mg /ml Dexamethasone;Moist Heat;Therapeutic exercise;Therapeutic activities;Ultrasound;Neuromuscular re-education;Patient/family education;Manual techniques;Passive range of motion   PT Next Visit Plan electrical stimulation; monitor home exercises   PT Home Exercise Plan continue with home exercises for strength and ROM      Patient will benefit from skilled therapeutic intervention in order to improve the following deficits and impairments:  Decreased activity tolerance, Decreased mobility, Decreased strength, Increased edema, Impaired flexibility, Pain, Impaired UE functional use, Increased muscle spasms, Decreased endurance, Decreased range of motion  Visit Diagnosis: Stiffness of left shoulder, not elsewhere classified  Muscle weakness (generalized)  Pain in left shoulder     Problem List There are no active problems to display for this patient.   Jomarie Longs PT 11/26/2015, 6:25 PM  [facility] [address], [place], [address] Phone: [phone]   Fax:  [phone]  Name: Megan York MRN: [id number] Date of Birth: 03-13-1961

## 2015-11-28 ENCOUNTER — Ambulatory Visit: Payer: 59 | Admitting: Physical Therapy

## 2015-11-28 DIAGNOSIS — M25512 Pain in left shoulder: Secondary | ICD-10-CM

## 2015-11-28 DIAGNOSIS — M25612 Stiffness of left shoulder, not elsewhere classified: Secondary | ICD-10-CM

## 2015-11-28 DIAGNOSIS — M6281 Muscle weakness (generalized): Secondary | ICD-10-CM

## 2015-11-29 NOTE — Therapy (Signed)
[facility] [address] [place], [address] Phone: [phone]   Fax:  [phone]  Physical Therapy Treatment  Patient Details  Name: Megan York MRN: [id number] Date of Birth: 1960-12-14 Referring Provider: Marlena Clipper, MD  Encounter Date: 11/28/2015      PT End of Session - 11/28/15 1830    Visit Number 9   Number of Visits 12   Date for PT Re-Evaluation 12/05/15   PT Start Time 1745   PT Stop Time 1820   PT Time Calculation (min) 35 min   Activity Tolerance Patient tolerated treatment well   Behavior During Therapy [facility] for tasks assessed/performed      No past medical history on file.  No past surgical history on file.  There were no vitals filed for this visit.      Subjective Assessment - 11/28/15 1800    Subjective Patient reports she is progressing with less pain in her left shoulder with current treament and would like to continue for just the electrical stimulation to keep costs down. She is at least 50% or better since beginning physical and is exercising at home as instructed and not lifting or doing anything to aggravate her pain. She is concerned that she still has pain and would like to have further testing done to be sure there is nothing else wrong with her shoulder (ie tear of muscle)   Limitations Lifting;House hold activities;Other (comment)  work related lifting, other activities   Patient Stated Goals Decrease shoulder pain and avoiding surgery.    Currently in Pain? Yes   Pain Score 1    Pain Location Shoulder   Pain Orientation Left   Pain Descriptors / Indicators Aching   Pain Type Acute pain   Pain Onset More than a month ago   Pain Frequency Intermittent      Objective:  AROM; left shoulder forward elevation WNL's with mild soreness and less difficulty than previous session Palpation: left upper back, trapezius muscle: + tenderness and increased  spasms; left shoulder lateral aspect with + tenderness (decreased from previous session)  Treatment:   Therapeutic exercise:   patient performed exercises with guidance, verbal and tactile cues and demonstration of PT: Reassessed scapular retractions in doorway with cuing to perform with shoulders in correct alignment (to perform 3-5x/day)  Modalities: Electrical stimulation:  Left shoulder:  High Volt: 2 large electrodes: applied to lateral/inferior posterior aspect of shoulder to decrease muscle spasms and pain; Russian stim: 2 large electrodes applied  to medial border of the scapula/lower trapezius muscle to improve muscular activation with patient positioned in sitting for 20 min with ice applied over the left shoulder  Patient response to treatment:  Decreased pain by 50% after performing modalities treatment indicating decreased muscle spasms and guarding. Good verbal understanding of home exercises with return demonstration          PT Long Term Goals - 10/24/15 1947    PT LONG TERM GOAL #1   Title Patient will demonstrate a QuickDASH score of under 20% by 12/05/15 to show significant improvement in shoulder function and ability to raise her arm overhead   Baseline QuickDASH: 30%   Status New   PT LONG TERM GOAL #2   Title Patient will be able to perform shoulder flexion of 160 degrees without onset of symptoms by 12/05/15 to show sigificant improvement in shoulder function and ability to perform reaching activities.   Baseline Increased pain  at 130 of shoulder flexion   Status New   PT LONG TERM GOAL #3   Title Patient will be independent with exercise performance and progression focused on improving shoulder strength and endurance by 12/05/15 to allow for further benefits of therapy after discharge from therapy   Baseline dependent for exercise performance and progression   Status New               Plan - 11/28/15 1829    Clinical Impression Statement  Patient demostrates decreasing pain in left shoulder and improving function with daily tasks using left UE. She is responding well to electrical stimulation and independent home program of exercise and should continue to progress with additional physical therapy intervention   Rehab Potential Good   PT Frequency 2x / week   PT Duration 6 weeks   PT Treatment/Interventions Cryotherapy;Electrical Stimulation;Iontophoresis 4mg /ml Dexamethasone;Moist Heat;Therapeutic exercise;Therapeutic activities;Ultrasound;Neuromuscular re-education;Patient/family education;Manual techniques;Passive range of motion   PT Next Visit Plan electrical stimulation; monitor home exercises   PT Home Exercise Plan continue with home exercises for strength and ROM      Patient will benefit from skilled therapeutic intervention in order to improve the following deficits and impairments:  Decreased activity tolerance, Decreased mobility, Decreased strength, Increased edema, Impaired flexibility, Pain, Impaired UE functional use, Increased muscle spasms, Decreased endurance, Decreased range of motion  Visit Diagnosis: Stiffness of left shoulder, not elsewhere classified  Muscle weakness (generalized)  Pain in left shoulder     Problem List There are no active problems to display for this patient.   Jomarie Longs PT 11/29/2015, 9:24 AM  [facility] [address] [place], [address] Phone: [phone]   Fax:  [phone]  Name: Janashia Honey MRN: [id number] Date of Birth: 1960/07/10

## 2015-12-03 ENCOUNTER — Ambulatory Visit: Payer: 59 | Admitting: Physical Therapy

## 2015-12-05 ENCOUNTER — Ambulatory Visit: Payer: 59 | Admitting: Physical Therapy

## 2015-12-10 ENCOUNTER — Ambulatory Visit: Payer: 59 | Admitting: Physical Therapy

## 2015-12-12 ENCOUNTER — Ambulatory Visit: Payer: 59 | Admitting: Physical Therapy

## 2016-01-10 ENCOUNTER — Other Ambulatory Visit: Payer: Self-pay | Admitting: Orthopedic Surgery

## 2016-01-10 DIAGNOSIS — M7542 Impingement syndrome of left shoulder: Secondary | ICD-10-CM

## 2016-01-15 ENCOUNTER — Ambulatory Visit
Admission: RE | Admit: 2016-01-15 | Discharge: 2016-01-15 | Disposition: A | Discharge status: Home / Self Care | Payer: 59 | Source: Ambulatory Visit | Attending: Orthopedic Surgery | Admitting: Orthopedic Surgery

## 2016-01-15 DIAGNOSIS — M67814 Other specified disorders of tendon, left shoulder: Secondary | ICD-10-CM | POA: Diagnosis not present

## 2016-01-15 DIAGNOSIS — M7542 Impingement syndrome of left shoulder: Secondary | ICD-10-CM | POA: Diagnosis present

## 2016-02-13 ENCOUNTER — Encounter
Admission: RE | Admit: 2016-02-13 | Discharge: 2016-02-13 | Disposition: A | Discharge status: Home / Self Care | Payer: 59 | Source: Ambulatory Visit | Attending: Orthopedic Surgery | Admitting: Orthopedic Surgery

## 2016-02-13 HISTORY — DX: Type 2 diabetes mellitus without complications: E11.9

## 2016-02-13 HISTORY — DX: Bariatric surgery status: Z98.84

## 2016-02-13 HISTORY — DX: Hypothyroidism, unspecified: E03.9

## 2016-02-13 HISTORY — DX: Chronic kidney disease, unspecified: N18.9

## 2016-02-13 HISTORY — DX: Other seasonal allergic rhinitis: J30.2

## 2016-02-13 HISTORY — DX: Malignant (primary) neoplasm, unspecified: C80.1

## 2016-02-13 HISTORY — DX: Unspecified osteoarthritis, unspecified site: M19.90

## 2016-02-13 HISTORY — DX: Family history of other specified conditions: Z84.89

## 2016-02-13 NOTE — Patient Instructions (Signed)
  Your procedure is scheduled on: 02-20-16 (THURSDAY) Report to Same Day Surgery 2nd floor medical mall To find out your arrival time please call [phone] between 1PM - 3PM on 02-19-16 [facility])  Remember: Instructions that are not followed completely may result in serious medical risk, up to and including death, or upon the discretion of your surgeon and anesthesiologist your surgery may need to be rescheduled.    _x___ 1. Do not eat food or drink liquids after midnight. No gum chewing or hard candies.     __x__ 2. No Alcohol for 24 hours before or after surgery.   __x__3. No Smoking for 24 prior to surgery.   ____  4. Bring all medications with you on the day of surgery if instructed.    __x__ 5. Notify your doctor if there is any change in your medical condition     (cold, fever, infections).     Do not wear jewelry, make-up, hairpins, clips or nail polish.  Do not wear lotions, powders, or perfumes. You may wear deodorant.  Do not shave 48 hours prior to surgery. Men may shave face and neck.  Do not bring valuables to the hospital.    [facility] is not responsible for any belongings or valuables.               Contacts, dentures or bridgework may not be worn into surgery.  Leave your suitcase in the car. After surgery it may be brought to your room.  For patients admitted to the hospital, discharge time is determined by your treatment team.   Patients discharged the day of surgery will not be allowed to drive home.    Please read over the following fact sheets that you were given:   [facility] Preparing for Surgery and or MRSA Information   _x___ Take these medicines the morning of surgery with A SIP OF WATER:    1. ARMOUR THYROID  2.  3.  4.  5.  6.  ____Fleets enema or Magnesium Citrate as directed.   _x___ Use CHG Soap or sage wipes as directed on instruction sheet   ____ Use inhalers on the day of surgery and bring to hospital day of surgery  ____ Stop  metformin 2 days prior to surgery    ____ Take 1/2 of usual insulin dose the night before surgery and none on the morning of surgery.   ____ Stop aspirin or coumadin, or plavix  x__ Stop Anti-inflammatories such as Advil, Aleve, Ibuprofen, Motrin, Naproxen,          Naprosyn, Goodies powders or aspirin products NOW-Ok to take Tylenol.   ____ Stop supplements until after surgery.    ____ Bring C-Pap to the hospital.

## 2016-02-17 ENCOUNTER — Other Ambulatory Visit: Payer: Self-pay

## 2016-02-17 ENCOUNTER — Encounter
Admission: RE | Admit: 2016-02-17 | Discharge: 2016-02-17 | Disposition: A | Discharge status: Home / Self Care | Payer: 59 | Source: Ambulatory Visit | Attending: Orthopedic Surgery | Admitting: Orthopedic Surgery

## 2016-02-17 DIAGNOSIS — Z79899 Other long term (current) drug therapy: Secondary | ICD-10-CM | POA: Diagnosis not present

## 2016-02-17 DIAGNOSIS — E119 Type 2 diabetes mellitus without complications: Secondary | ICD-10-CM | POA: Diagnosis not present

## 2016-02-17 DIAGNOSIS — M1812 Unilateral primary osteoarthritis of first carpometacarpal joint, left hand: Secondary | ICD-10-CM | POA: Diagnosis present

## 2016-02-17 DIAGNOSIS — Z87891 Personal history of nicotine dependence: Secondary | ICD-10-CM | POA: Diagnosis not present

## 2016-02-17 DIAGNOSIS — Z01818 Encounter for other preprocedural examination: Secondary | ICD-10-CM | POA: Diagnosis not present

## 2016-02-20 ENCOUNTER — Ambulatory Visit
Admission: RE | Admit: 2016-02-20 | Discharge: 2016-02-20 | Disposition: A | Discharge status: Home / Self Care | Payer: 59 | Source: Ambulatory Visit | Attending: Orthopedic Surgery | Admitting: Orthopedic Surgery

## 2016-02-20 ENCOUNTER — Encounter
Admission: RE | Disposition: A | Discharge status: Home / Self Care | Payer: Self-pay | Source: Ambulatory Visit | Attending: Orthopedic Surgery

## 2016-02-20 ENCOUNTER — Encounter: Payer: Self-pay | Admitting: *Deleted

## 2016-02-20 ENCOUNTER — Ambulatory Visit: Payer: 59 | Admitting: Anesthesiology

## 2016-02-20 DIAGNOSIS — E119 Type 2 diabetes mellitus without complications: Secondary | ICD-10-CM | POA: Insufficient documentation

## 2016-02-20 DIAGNOSIS — M1812 Unilateral primary osteoarthritis of first carpometacarpal joint, left hand: Secondary | ICD-10-CM | POA: Diagnosis not present

## 2016-02-20 DIAGNOSIS — Z87891 Personal history of nicotine dependence: Secondary | ICD-10-CM | POA: Insufficient documentation

## 2016-02-20 DIAGNOSIS — Z79899 Other long term (current) drug therapy: Secondary | ICD-10-CM | POA: Insufficient documentation

## 2016-02-20 HISTORY — PX: CARPOMETACARPAL (CMC) FUSION OF THUMB: SHX6290

## 2016-02-20 LAB — GLUCOSE, CAPILLARY: Glucose-Capillary: 103 mg/dL — ABNORMAL HIGH (ref 65–99)

## 2016-02-20 SURGERY — CARPOMETACARPAL (CMC) FUSION OF THUMB
Anesthesia: General | Laterality: Left | Wound class: Clean

## 2016-02-20 MED ORDER — FENTANYL CITRATE (PF) 100 MCG/2ML IJ SOLN
INTRAMUSCULAR | Status: DC | PRN
  Administered 2016-02-20: 50 ug via INTRAVENOUS
  Administered 2016-02-20 (×2): 100 ug via INTRAVENOUS
  Administered 2016-02-20: 50 ug via INTRAVENOUS

## 2016-02-20 MED ORDER — METOCLOPRAMIDE HCL 10 MG PO TABS: 5.0000 mg | ORAL_TABLET | Freq: Three times a day (TID) | ORAL | Status: DC | PRN

## 2016-02-20 MED ORDER — ONDANSETRON HCL 4 MG/2ML IJ SOLN
INTRAMUSCULAR | Status: DC | PRN
  Administered 2016-02-20: 4 mg via INTRAVENOUS

## 2016-02-20 MED ORDER — FAMOTIDINE 20 MG PO TABS
20.0000 mg | ORAL_TABLET | Freq: Once | ORAL | Status: AC
  Administered 2016-02-20: 20 mg via ORAL

## 2016-02-20 MED ORDER — VANCOMYCIN HCL IN DEXTROSE 1-5 GM/200ML-% IV SOLN
INTRAVENOUS | Status: AC
  Administered 2016-02-20: 1000 mg via INTRAVENOUS
  Filled 2016-02-20: qty 200

## 2016-02-20 MED ORDER — MORPHINE SULFATE (PF) 2 MG/ML IV SOLN
INTRAVENOUS | Status: AC
  Filled 2016-02-20: qty 1

## 2016-02-20 MED ORDER — NEOMYCIN-POLYMYXIN B GU 40-200000 IR SOLN
Status: DC | PRN
  Administered 2016-02-20: 1 mL

## 2016-02-20 MED ORDER — LIDOCAINE HCL (CARDIAC) 20 MG/ML IV SOLN
INTRAVENOUS | Status: DC | PRN
  Administered 2016-02-20: 100 mg via INTRAVENOUS

## 2016-02-20 MED ORDER — HYDROCODONE-ACETAMINOPHEN 5-325 MG PO TABS
1.0000 | ORAL_TABLET | ORAL | Status: DC | PRN
  Administered 2016-02-20: 1 via ORAL

## 2016-02-20 MED ORDER — MIDAZOLAM HCL 2 MG/2ML IJ SOLN
INTRAMUSCULAR | Status: DC | PRN
  Administered 2016-02-20: 2 mg via INTRAVENOUS

## 2016-02-20 MED ORDER — METOCLOPRAMIDE HCL 5 MG/ML IJ SOLN: 5.0000 mg | Freq: Three times a day (TID) | INTRAMUSCULAR | Status: DC | PRN

## 2016-02-20 MED ORDER — ACETAMINOPHEN 10 MG/ML IV SOLN
INTRAVENOUS | Status: AC
  Filled 2016-02-20: qty 100

## 2016-02-20 MED ORDER — MORPHINE SULFATE (PF) 2 MG/ML IV SOLN
1.0000 mg | INTRAVENOUS | Status: DC | PRN
  Administered 2016-02-20 (×6): 2 mg via INTRAVENOUS

## 2016-02-20 MED ORDER — HYDROCODONE-ACETAMINOPHEN 5-325 MG PO TABS: 1.0000 | ORAL_TABLET | Freq: Four times a day (QID) | ORAL | Status: DC | PRN

## 2016-02-20 MED ORDER — PROPOFOL 10 MG/ML IV BOLUS
INTRAVENOUS | Status: DC | PRN
  Administered 2016-02-20: 50 mg via INTRAVENOUS
  Administered 2016-02-20: 150 mg via INTRAVENOUS

## 2016-02-20 MED ORDER — SODIUM CHLORIDE 0.9 % IV SOLN
INTRAVENOUS | Status: DC
  Administered 2016-02-20: 07:00:00 via INTRAVENOUS

## 2016-02-20 MED ORDER — ACETAMINOPHEN 10 MG/ML IV SOLN
INTRAVENOUS | Status: DC | PRN
  Administered 2016-02-20: 1000 mg via INTRAVENOUS

## 2016-02-20 MED ORDER — BUPIVACAINE HCL (PF) 0.5 % IJ SOLN
INTRAMUSCULAR | Status: AC
  Filled 2016-02-20: qty 30

## 2016-02-20 MED ORDER — HYDROCODONE-ACETAMINOPHEN 5-325 MG PO TABS: 1.0000 | ORAL_TABLET | Freq: Four times a day (QID) | ORAL | 0 refills | Status: AC | PRN

## 2016-02-20 MED ORDER — DEXAMETHASONE SODIUM PHOSPHATE 4 MG/ML IJ SOLN
INTRAMUSCULAR | Status: DC | PRN
  Administered 2016-02-20: 10 mg via INTRAVENOUS

## 2016-02-20 MED ORDER — FENTANYL CITRATE (PF) 100 MCG/2ML IJ SOLN
INTRAMUSCULAR | Status: AC
  Filled 2016-02-20: qty 2

## 2016-02-20 MED ORDER — FAMOTIDINE 20 MG PO TABS
ORAL_TABLET | ORAL | Status: AC
  Filled 2016-02-20: qty 1

## 2016-02-20 MED ORDER — FENTANYL CITRATE (PF) 100 MCG/2ML IJ SOLN
25.0000 ug | INTRAMUSCULAR | Status: DC | PRN
  Administered 2016-02-20 (×4): 25 ug via INTRAVENOUS

## 2016-02-20 MED ORDER — ESMOLOL HCL 100 MG/10ML IV SOLN
INTRAVENOUS | Status: DC | PRN
  Administered 2016-02-20: 30 mg via INTRAVENOUS

## 2016-02-20 MED ORDER — GLYCOPYRROLATE 0.2 MG/ML IJ SOLN
INTRAMUSCULAR | Status: DC | PRN
  Administered 2016-02-20: 0.3 mg via INTRAVENOUS

## 2016-02-20 MED ORDER — ALFENTANIL 500 MCG/ML IJ INJ
INJECTION | INTRAMUSCULAR | Status: DC | PRN
  Administered 2016-02-20: .2 ug/kg/min via INTRAVENOUS

## 2016-02-20 MED ORDER — KETAMINE HCL 50 MG/ML IJ SOLN
INTRAMUSCULAR | Status: DC | PRN
  Administered 2016-02-20: 50 mg via INTRAVENOUS

## 2016-02-20 MED ORDER — KETOROLAC TROMETHAMINE 30 MG/ML IJ SOLN
INTRAMUSCULAR | Status: DC | PRN
  Administered 2016-02-20: 30 mg via INTRAVENOUS

## 2016-02-20 MED ORDER — VANCOMYCIN HCL IN DEXTROSE 1-5 GM/200ML-% IV SOLN
1000.0000 mg | Freq: Once | INTRAVENOUS | Status: AC
  Administered 2016-02-20: 1000 mg via INTRAVENOUS

## 2016-02-20 MED ORDER — ONDANSETRON HCL 4 MG PO TABS: 4.0000 mg | ORAL_TABLET | Freq: Four times a day (QID) | ORAL | Status: DC | PRN

## 2016-02-20 MED ORDER — MORPHINE SULFATE (PF) 10 MG/ML IV SOLN
INTRAVENOUS | Status: AC
  Filled 2016-02-20: qty 1

## 2016-02-20 MED ORDER — ONDANSETRON HCL 4 MG/2ML IJ SOLN: 4.0000 mg | Freq: Four times a day (QID) | INTRAMUSCULAR | Status: DC | PRN

## 2016-02-20 MED ORDER — NEOMYCIN-POLYMYXIN B GU 40-200000 IR SOLN
Status: AC
  Filled 2016-02-20: qty 2

## 2016-02-20 MED ORDER — BUPIVACAINE HCL (PF) 0.5 % IJ SOLN
INTRAMUSCULAR | Status: DC | PRN
  Administered 2016-02-20: 10 mL

## 2016-02-20 MED ORDER — HYDROCODONE-ACETAMINOPHEN 5-325 MG PO TABS
ORAL_TABLET | ORAL | Status: AC
  Filled 2016-02-20: qty 1

## 2016-02-20 MED ORDER — SODIUM CHLORIDE 0.9 % IV SOLN: INTRAVENOUS | Status: DC

## 2016-02-20 MED ORDER — ONDANSETRON HCL 4 MG/2ML IJ SOLN: 4.0000 mg | Freq: Once | INTRAMUSCULAR | Status: DC | PRN

## 2016-02-20 SURGICAL SUPPLY — 39 items
BANDAGE ELASTIC 3 CLIP NS LF (GAUZE/BANDAGES/DRESSINGS) ×3 IMPLANT
BANDAGE ELASTIC 3 LF NS (GAUZE/BANDAGES/DRESSINGS) ×3 IMPLANT
BANDAGE STRETCH 3X4.1 STRL (GAUZE/BANDAGES/DRESSINGS) IMPLANT
BLADE OSC/SAGITTAL 5.5X25 (BLADE) ×3 IMPLANT
BNDG ESMARK 4X12 TAN STRL LF (GAUZE/BANDAGES/DRESSINGS) IMPLANT
CAST PADDING 3X4FT ST 30246 (SOFTGOODS) ×2
CORD MONOPOLAR M/FML 12FT (MISCELLANEOUS) IMPLANT
CUFF TOURN 18 STER (MISCELLANEOUS) ×3 IMPLANT
DRAPE FLUOR MINI C-ARM 54X84 (DRAPES) ×3 IMPLANT
ELECT CAUTERY BLADE 6.4 (BLADE) ×3 IMPLANT
FORCEPS JEWEL BIP 4-3/4 STR (INSTRUMENTS) IMPLANT
GAUZE PETRO XEROFOAM 1X8 (MISCELLANEOUS) ×3 IMPLANT
GAUZE SPONGE 4X4 12PLY STRL (GAUZE/BANDAGES/DRESSINGS) ×3 IMPLANT
GAUZE XEROFORM 4X4 STRL (GAUZE/BANDAGES/DRESSINGS) ×3 IMPLANT
GLOVE BIOGEL PI IND STRL 9 (GLOVE) ×2 IMPLANT
GLOVE BIOGEL PI INDICATOR 9 (GLOVE) ×4
GLOVE SURG SYN 9.0  PF PI (GLOVE) ×4
GLOVE SURG SYN 9.0 PF PI (GLOVE) ×2 IMPLANT
GOWN SRG 2XL LVL 4 RGLN SLV (GOWNS) ×1 IMPLANT
GOWN STRL NON-REIN 2XL LVL4 (GOWNS) ×2
GOWN STRL REUS W/ TWL LRG LVL3 (GOWN DISPOSABLE) ×1 IMPLANT
GOWN STRL REUS W/TWL LRG LVL3 (GOWN DISPOSABLE) ×2
KIT RM TURNOVER STRD PROC AR (KITS) ×3 IMPLANT
NDL KEITH SZ2.5 (NEEDLE) IMPLANT
NDL SAFETY 25GX1.5 (NEEDLE) ×3 IMPLANT
NS IRRIG 500ML POUR BTL (IV SOLUTION) ×3 IMPLANT
PACK EXTREMITY ARMC (MISCELLANEOUS) ×3 IMPLANT
PAD CAST CTTN 3X4 STRL (SOFTGOODS) ×1 IMPLANT
SPLINT CAST 1 STEP 3X12 (MISCELLANEOUS) IMPLANT
SPLINT WRIST M LT TX990308 (SOFTGOODS) IMPLANT
STOCKINETTE STRL 4IN 9604848 (GAUZE/BANDAGES/DRESSINGS) IMPLANT
SUT ETHILON 4-0 (SUTURE) ×2
SUT ETHILON 4-0 FS2 18XMFL BLK (SUTURE) ×1
SUT VIC AB 0 CT2 27 (SUTURE) ×3 IMPLANT
SUTURE ETHLN 4-0 FS2 18XMF BLK (SUTURE) ×1 IMPLANT
SYRINGE 10CC LL (SYRINGE) ×3 IMPLANT
SYSTEM IMPLANT TIGHTROPE MINI (Anchor) ×3 IMPLANT
WIRE Z .035 C-WIRE SPADE TIP (WIRE) IMPLANT
WIRE Z .062 C-WIRE SPADE TIP (WIRE) IMPLANT

## 2016-02-20 NOTE — Anesthesia Preprocedure Evaluation (Signed)
Anesthesia Evaluation  Patient identified by MRN, date of birth, ID band Patient awake    Reviewed: Allergy & Precautions, NPO status , Patient's Chart, lab work & pertinent test results  History of Anesthesia Complications Negative for: history of anesthetic complications  Airway Mallampati: II       Dental   Pulmonary neg pulmonary ROS, former smoker,           Cardiovascular negative cardio ROS       Neuro/Psych negative neurological ROS     GI/Hepatic negative GI ROS, Neg liver ROS,   Endo/Other  diabetes (diet controlled), Well Controlled, Type 2Hypothyroidism   Renal/GU Renal InsufficiencyRenal disease     Musculoskeletal  (+) Arthritis , Osteoarthritis,    Abdominal   Peds  Hematology   Anesthesia Other Findings   Reproductive/Obstetrics                             Anesthesia Physical Anesthesia Plan  ASA: II  Anesthesia Plan: General   Post-op Pain Management:    Induction: Intravenous  Airway Management Planned: LMA  Additional Equipment:   Intra-op Plan:   Post-operative Plan:   Informed Consent: I have reviewed the patients History and Physical, chart, labs and discussed the procedure including the risks, benefits and alternatives for the proposed anesthesia with the patient or authorized representative who has indicated his/her understanding and acceptance.     Plan Discussed with:   Anesthesia Plan Comments:         Anesthesia Quick Evaluation

## 2016-02-20 NOTE — Op Note (Signed)
02/20/2016  9:08 AM  PATIENT:  Megan York  55 y.o. female  PRE-OPERATIVE DIAGNOSIS:  PRIMARY OSTEOARTHRITIS OF FIRST CARPOMETACARPAL JOINT OF LEFT HAND  POST-OPERATIVE DIAGNOSIS:  PRIMARY OSTEOARTHRITIS OF FIRST CARPOMETACARPAL JOINT OF LEFT HAND  PROCEDURE:  Procedure(s): CMC ARTHROPLASTY (Left)  SURGEON: Laurene Footman, MD  ASSISTANTS: None  ANESTHESIA:   general  EBL:  Total I/O In: 550 [I.V.:550] Out: 1 [Blood:1]  BLOOD ADMINISTERED:none  DRAINS: none   LOCAL MEDICATIONS USED:  MARCAINE     SPECIMEN:  No Specimen  DISPOSITION OF SPECIMEN:  N/A  COUNTS:  YES  TOURNIQUET:  * No tourniquets in log *  IMPLANTS: Arthrex mini tight rope 1  DICTATION: .Dragon Dictation patient was brought the operating room and after adequate general anesthesia was obtained left arm was prepped draped in sterile fashion. After patient identification and timeout procedures were completed tourniquet was raised to 250 mm mercury. Incision was made centered over the [facility] joint of the left thumb with subcutaneous nerves protected. The capsule was opened and the trapezium exposed. After performing this portion of the procedure going more volarly through the thenar musculature the base of the first metacarpal was exposed and the K wire for the tight rope was passed followed by passage of the implant. The button on the second metacarpal was then passed 2 sutures and tightened to hold the thumb metacarpal in the appropriate position suspended from the second metacarpal. Mini C-arm was used to do this after this was tightened and the [facility] joint appeared to be in good position of the trapezium was removed this was difficult because of adhesions to the capsule after removal of the trapezium the thumb CMC joint appeared maintained its position with the mini tight rope. The wound was thoroughly irrigated and then closed with 0 Vicryl for the capsule and 4-0 nylon for all skin sutures in a simple interrupted  manner. A volar thumb spica splint was then applied after application of Xeroform 4 x 4's web roll and Ace wrap additionally cc of half percent Sensorcaine was infiltrated in the area of the incision to aid in postop analgesia.  PLAN OF CARE: Discharge to home after PACU  PATIENT DISPOSITION:  PACU - hemodynamically stable.

## 2016-02-20 NOTE — H&P (Signed)
Reviewed paper H+P, will be scanned into chart. No changes noted.  

## 2016-02-20 NOTE — Anesthesia Procedure Notes (Signed)
Procedure Name: LMA Insertion Date/Time: 02/20/2016 7:32 AM Performed by: Rosaria Ferries, Garrie Elenes Pre-anesthesia Checklist: Patient identified, Emergency Drugs available, Suction available and Patient being monitored Patient Re-evaluated:Patient Re-evaluated prior to inductionOxygen Delivery Method: Circle system utilized Preoxygenation: Pre-oxygenation with 100% oxygen Intubation Type: IV induction LMA: LMA inserted LMA Size: 4.0 Number of attempts: 1 Placement Confirmation: breath sounds checked- equal and bilateral Tube secured with: Tape Dental Injury: Teeth and Oropharynx as per pre-operative assessment

## 2016-02-20 NOTE — Anesthesia Postprocedure Evaluation (Signed)
Anesthesia Post Note  Patient: Megan York  Procedure(s) Performed: Procedure(s) (LRB): CMC ARTHROPLASTY (Left)  Patient location during evaluation: PACU Anesthesia Type: General Level of consciousness: awake and alert Pain management: pain level controlled Vital Signs Assessment: post-procedure vital signs reviewed and stable Respiratory status: spontaneous breathing and respiratory function stable Cardiovascular status: stable Anesthetic complications: no    Last Vitals:  Vitals:   02/20/16 1049 02/20/16 1125  BP: (!) 151/92 (!) 158/88  Pulse: 74 78  Resp: 16 16  Temp: 36.6 C     Last Pain:  Vitals:   02/20/16 1125  TempSrc:   PainSc: 3                  KEPHART,WILLIAM K

## 2016-02-20 NOTE — Discharge Instructions (Signed)
AMBULATORY SURGERY  DISCHARGE INSTRUCTIONS   1) The drugs that you were given will stay in your system until tomorrow so for the next 24 hours you should not:  A) Drive an automobile B) Make any legal decisions C) Drink any alcoholic beverage   2) You may resume regular meals tomorrow.  Today it is better to start with liquids and gradually work up to solid foods.  You may eat anything you prefer, but it is better to start with liquids, then soup and crackers, and gradually work up to solid foods.   3) Please notify your doctor immediately if you have any unusual bleeding, trouble breathing, redness and pain at the surgery site, drainage, fever, or pain not relieved by medication. 4)   5) Your post-operative visit with Dr.                                     is: Date:                        Time:    Please call to schedule your post-operative visit.  6) Additional Instructions: Elevate arm and ice on the back of the wrist today and tomorrow as needed. Pain medicine as directed. Work fingers other than thumb.

## 2016-02-20 NOTE — Transfer of Care (Signed)
Immediate Anesthesia Transfer of Care Note  Patient: Megan York  Procedure(s) Performed: Procedure(s): CMC ARTHROPLASTY (Left)  Patient Location: PACU  Anesthesia Type:General  Level of Consciousness: awake, alert , oriented and patient cooperative  Airway & Oxygen Therapy: Patient Spontanous Breathing and Patient connected to nasal cannula oxygen  Post-op Assessment: Report given to RN and Post -op Vital signs reviewed and stable  Post vital signs: Reviewed and stable  Last Vitals:  Vitals:   02/20/16 0614 02/20/16 0913  BP: (!) 161/93 (!) 175/103  Pulse: 83   Resp: 20   Temp: 36.4 C (!) 35.6 C    Last Pain:  Vitals:   02/20/16 0913  TempSrc:   PainSc: 8       Patients Stated Pain Goal: 2 ([id number])  Complications: No apparent anesthesia complications

## 2016-03-16 ENCOUNTER — Encounter: Payer: Self-pay | Admitting: Occupational Therapy

## 2016-03-16 ENCOUNTER — Ambulatory Visit: Payer: 59 | Attending: Orthopedic Surgery | Admitting: Occupational Therapy

## 2016-03-16 DIAGNOSIS — M25632 Stiffness of left wrist, not elsewhere classified: Secondary | ICD-10-CM | POA: Diagnosis present

## 2016-03-16 DIAGNOSIS — M6281 Muscle weakness (generalized): Secondary | ICD-10-CM | POA: Insufficient documentation

## 2016-03-16 DIAGNOSIS — M79642 Pain in left hand: Secondary | ICD-10-CM | POA: Diagnosis not present

## 2016-03-16 NOTE — Patient Instructions (Signed)
Contrast  AROM for MP thumb blocked AROM for PA and RA of thumb  Circular AROM  Opposition = pick up 1 cm foam block with all  AROM for wrist flexion, ext, RD and UD - as well as sup/pro 10 reps all  Tendonglides  10 reps  2 x day  Thumb spica on inbetween

## 2016-03-16 NOTE — Therapy (Signed)
[place] [facility] [address] [place], [address] Phone: [phone]   Fax:  [phone]  Occupational Therapy Evaluation  Patient Details  Name: Megan York MRN: [id number] Date of Birth: October 22, 1960 Referring Provider: Rudene Christians  Encounter Date: 03/16/2016      OT End of Session - 03/16/16 1008    Visit Number 1   Number of Visits 12   Date for OT Re-Evaluation 04/27/16   OT Start Time 0908   OT Stop Time 1003   OT Time Calculation (min) 55 min   Activity Tolerance Patient tolerated treatment well   Behavior During Therapy [facility] for tasks assessed/performed      Past Medical History:  Diagnosis Date  . Arthritis   . Cancer ([place])    thyroid  . Chronic kidney disease    h/o stones  . Diabetes mellitus without complication (HCC)    diet controlled-no meds  . Family history of adverse reaction to anesthesia    pts sister hard to wake up  . History of Roux-en-Y gastric bypass   . Hypothyroidism   . Seasonal allergies     Past Surgical History:  Procedure Laterality Date  . CARPOMETACARPAL ([place]) FUSION OF THUMB Left 02/20/2016   Procedure: [facility] ARTHROPLASTY;  Surgeon: Hessie Knows, MD;  Location: [facility] ORS;  Service: Orthopedics;  Laterality: Left;  . CHOLECYSTECTOMY    . DIAGNOSTIC LAPAROSCOPY    . GASTRIC BYPASS  2002  . ROTATOR CUFF REPAIR Bilateral   . THUMB ARTHROSCOPY    . TOTAL THYROIDECTOMY      There were no vitals filed for this visit.      Subjective Assessment - 03/16/16 0915    Subjective  I had my R thumb CMC done few years ago - doing good - L thumb doing okay - pain 2/10 - some numbness but better since surgery - keeping splint on at all times    Patient Stated Goals Want to get the use of my thumb back - ROM , strenght  better - and use L hand to grip, pull , lift , cut , open or do buttons    Currently in Pain? Yes   Pain Score 2    Pain Location Hand   Pain Orientation Left   Pain Descriptors / Indicators Aching;Burning;Numbness   Pain Type Surgical pain   Pain Onset 1 to 4 weeks ago   Pain Frequency Constant           [facility] OT Assessment - 03/16/16 0001      Assessment   Diagnosis L CMC arthroplasty   Referring Provider Rudene Christians   Onset Date 02/20/16     Precautions   Required Braces or Orthoses --  prefab thumb spica     Balance Screen   Has the patient fallen in the past 6 months No     Home  Environment   Lives With Spouse     Prior Function   Vocation Full time employment   Leisure Reading , grandkids, some cooking and cleaning -      AROM   Right Wrist Extension 62 Degrees   Right Wrist Flexion 82 Degrees   Right Wrist Radial Deviation 14 Degrees   Right Wrist Ulnar Deviation 32 Degrees   Left Wrist Extension 58 Degrees   Left Wrist Flexion 35 Degrees   Left Wrist Radial Deviation 17 Degrees   Left Wrist Ulnar Deviation 24 Degrees     Left  Hand AROM   L Thumb MCP 0-60 40 Degrees  45 R   L Thumb IP 0-80 50 Degrees  62 R   L Thumb Radial ADduction/ABduction 0-55 38  45 R   L Thumb Palmar ADduction/ABduction 0-45 38  43 R   L Thumb Opposition to Index --  Opposition to 3rd    L Index  MCP 0-90 75 Degrees      fluidotherapy done - with AROM for thumb and wrist in all planes -pain decrease to 0/10 -  Reviewed HEP     Contrast  AROM for MP thumb blocked AROM for PA and RA of thumb  Circular AROM  Opposition = pick up 1 cm foam block with all  AROM for wrist flexion, ext, RD and UD - as well as sup/pro 10 reps all  Tendonglides  10 reps  2 x day  Thumb spica on inbetween                 OT Education - 03/16/16 1007    Education provided Yes   Education Details HEP    Person(s) Educated Patient   Methods Explanation;Demonstration;Tactile cues;Verbal cues;Handout   Comprehension Verbal cues required;Returned demonstration;Verbalized understanding          OT Short Term Goals - 03/16/16 1016      OT  SHORT TERM GOAL #1   Title Pain on PRWHE improve with 20 points    Baseline 32/50 at eval on PRWHE   Time 3   Period Weeks     OT SHORT TERM GOAL #2   Title AROM in L thumb improve  for pt to do buttons, graps around empty glass   Baseline Opposition to 3rd digit , PA and RA 38, flexion of MP and IP see flowsheet   Time 4   Period Weeks   Status New     OT SHORT TERM GOAL #3   Title L wrist AROM improve to WNL compare to R to improve 20 points on functional use    Baseline Wrist AROM decrease compare to R - see flowsheet   Time 4   Period Weeks   Status New           OT Long Term Goals - 03/16/16 1020      OT LONG TERM GOAL #1   Title Function improve using L hand by at least 25 points    Baseline at eval on PRHWE function 43/50   Time 5   Period Weeks   Status New     OT LONG TERM GOAL #2   Title Grip strength improve to 50% compare to R to carry more than 5 lbs    Baseline NT because only 3 1/2 wks s/p   Time 6   Period Weeks   Status New     OT LONG TERM GOAL #3   Title Prehension strength improve to at least 50% compare R to carry plate, turn doorknob , cut food   Baseline NT - 3 1/2 wks s/p   Time 6   Period Weeks   Status New               Plan - 03/16/16 1008    Clinical Impression Statement Pt present at eval 3 1/2 wks s/p arthroplast of L CMC - pt in prefab thumb spica all the time - off with HEP - pt show increase pain , some edema and decrease AROM in thumb , wrist - decrease  strength - limiting her functional use of L hand in ADL' s and IADL's    Rehab Potential Good   OT Frequency 2x / week   OT Duration 6 weeks   OT Treatment/Interventions Self-care/ADL training;Fluidtherapy;Splinting;Patient/family education;Therapeutic exercises;Contrast Bath;Scar mobilization;Passive range of motion;Parrafin;Manual Therapy   Plan assess pain and progress with HEP   OT Home Exercise Plan see pt instruction   Consulted and Agree with Plan of Care Patient       Patient will benefit from skilled therapeutic intervention in order to improve the following deficits and impairments:  Decreased range of motion, Impaired flexibility, Decreased coordination, Increased edema, Impaired sensation, Pain, Impaired UE functional use, Decreased scar mobility, Decreased strength, Decreased knowledge of precautions, Decreased knowledge of use of DME  Visit Diagnosis: Pain in left hand - Plan: Ot plan of care cert/re-cert  Stiffness of left wrist, not elsewhere classified - Plan: Ot plan of care cert/re-cert  Muscle weakness (generalized) - Plan: Ot plan of care cert/re-cert    Problem List There are no active problems to display for this patient.   Rosalyn Gess OTR/L,CLT  03/16/2016, 10:31 AM  [place] [facility] [address], [place], [address] Phone: [phone]   Fax:  [phone]  Name: ASUKA LITZINGER MRN: [id number] Date of Birth: 06-02-1960

## 2016-03-20 ENCOUNTER — Ambulatory Visit: Payer: 59 | Attending: Orthopedic Surgery | Admitting: Occupational Therapy

## 2016-03-20 DIAGNOSIS — M6281 Muscle weakness (generalized): Secondary | ICD-10-CM | POA: Insufficient documentation

## 2016-03-20 DIAGNOSIS — M25612 Stiffness of left shoulder, not elsewhere classified: Secondary | ICD-10-CM | POA: Diagnosis present

## 2016-03-20 DIAGNOSIS — M79642 Pain in left hand: Secondary | ICD-10-CM | POA: Diagnosis not present

## 2016-03-20 DIAGNOSIS — M25632 Stiffness of left wrist, not elsewhere classified: Secondary | ICD-10-CM | POA: Diagnosis present

## 2016-03-20 NOTE — Patient Instructions (Signed)
Cont with heat or contrast  Soft tissue mobs to webspace and with PA and RA spreads Scar mobs massage - pt to do at home - and add Cica scar pad for night time   Gentle PROM for IP and MP flexion AROM for MP and IP   thumb blocked AROM for PA and RA of thumb  Circular AROM  Opposition = pick up 1 cm foam block with all - to each digit - can slide down 5th - stop when pull   AROM for wrist flexion, ext, RD and UD 10 reps all  Tendonglides  10 reps  2 x day  Thumb spica on inbetween

## 2016-03-20 NOTE — Therapy (Signed)
[place] [facility] [address], [place], [address] Phone: [phone]   Fax:  [phone]  Occupational Therapy Treatment  Patient Details  Name: Megan York MRN: [id number] Date of Birth: [birth year] Referring Provider: Rudene Christians  Encounter Date: 03/20/2016      OT End of Session - 03/20/16 1303    Visit Number 2   Number of Visits 12   Date for OT Re-Evaluation 04/27/16   OT Start Time 1250   OT Stop Time 1330   OT Time Calculation (min) 40 min   Activity Tolerance Patient tolerated treatment well   Behavior During Therapy [facility] for tasks assessed/performed      Past Medical History:  Diagnosis Date  . Arthritis   . Cancer ([place])    thyroid  . Chronic kidney disease    h/o stones  . Diabetes mellitus without complication (HCC)    diet controlled-no meds  . Family history of adverse reaction to anesthesia    pts sister hard to wake up  . History of Roux-en-Y gastric bypass   . Hypothyroidism   . Seasonal allergies     Past Surgical History:  Procedure Laterality Date  . CARPOMETACARPAL ([place]) FUSION OF THUMB Left 02/20/2016   Procedure: [facility] ARTHROPLASTY;  Surgeon: Hessie Knows, MD;  Location: [facility] ORS;  Service: Orthopedics;  Laterality: Left;  . CHOLECYSTECTOMY    . DIAGNOSTIC LAPAROSCOPY    . GASTRIC BYPASS  2002  . ROTATOR CUFF REPAIR Bilateral   . THUMB ARTHROSCOPY    . TOTAL THYROIDECTOMY      There were no vitals filed for this visit.      Subjective Assessment - 03/20/16 1301    Subjective  Maybe little more sore - I am trying to use it when I am bathing - and dressing - but then back in splint pain better - I am moving my thumb better    Patient Stated Goals Want to get the use of my thumb back - ROM , strenght  better - and use L hand to grip, pull , lift , cut , open or do buttons    Currently in Pain? Yes   Pain Score 1    Pain Location Hand   Pain Orientation Left   Pain  Descriptors / Indicators Aching   Pain Type Surgical pain            [facility] OT Assessment - 03/20/16 0001      Left Hand AROM   L Thumb MCP 0-60 45 Degrees   L Thumb IP 0-80 60 Degrees   L Thumb Radial ADduction/ABduction 0-55 41   L Thumb Palmar ADduction/ABduction 0-45 41                  OT Treatments/Exercises (OP) - 03/20/16 0001      LUE Fluidotherapy   Number Minutes Fluidotherapy 10 Minutes   LUE Fluidotherapy Location Hand   Comments AROM for thumb i n all planes to decrease pain and increae ROM     AROM for thumb assess  fluidotherapy done - with AROM for thumb and wrist in all planes -pain decrease to 0/10 -   Soft tissue mobs to webspace and with PA and RA spreads Scar mobs and massage - pt to do at home - and add Cica scar pad for night time   Gentle PROM for IP and MP flexion AROM for MP and IP   thumb blocked AROM  for PA and RA of thumb  Circular AROM  Opposition = pick up 1 cm foam block with all - to each digit - can slide down 5th - stop when pull After fluido slide down to base of 5th without pain  AAROM for wrist flexion, ext, RD and UD 10 reps all  Tendonglides  10 reps  Min A and mod v/c 2 x day  Thumb spica on inbetween               OT Education - 03/20/16 1303    Education provided Yes   Education Details HEP update - see pt instruction    Person(s) Educated Patient   Methods Explanation;Demonstration;Tactile cues;Verbal cues   Comprehension Verbal cues required;Returned demonstration;Verbalized understanding          OT Short Term Goals - 03/16/16 1016      OT SHORT TERM GOAL #1   Title Pain on PRWHE improve with 20 points    Baseline 32/50 at eval on PRWHE   Time 3   Period Weeks     OT SHORT TERM GOAL #2   Title AROM in L thumb improve  for pt to do buttons, graps around empty glass   Baseline Opposition to 3rd digit , PA and RA 38, flexion of MP and IP see flowsheet   Time 4   Period Weeks    Status New     OT SHORT TERM GOAL #3   Title L wrist AROM improve to WNL compare to R to improve 20 points on functional use    Baseline Wrist AROM decrease compare to R - see flowsheet   Time 4   Period Weeks   Status New           OT Long Term Goals - 03/16/16 1020      OT LONG TERM GOAL #1   Title Function improve using L hand by at least 25 points    Baseline at eval on PRHWE function 43/50   Time 5   Period Weeks   Status New     OT LONG TERM GOAL #2   Title Grip strength improve to 50% compare to R to carry more than 5 lbs    Baseline NT because only 3 1/2 wks s/p   Time 6   Period Weeks   Status New     OT LONG TERM GOAL #3   Title Prehension strength improve to at least 50% compare R to carry plate, turn doorknob , cut food   Baseline NT - 3 1/2 wks s/p   Time 6   Period Weeks   Status New               Plan - 03/20/16 1339    Clinical Impression Statement Pt about 4 wks s/p L CMC arthroplasty - pt show increase AROM in thumb and wrist - add some soft tissue mobs to webspace prior to thumb PA and RA to increase CMC ROM - and scar massage and pad for night time     Rehab Potential Good   OT Frequency 2x / week   OT Duration 6 weeks   OT Treatment/Interventions Self-care/ADL training;Fluidtherapy;Splinting;Patient/family education;Therapeutic exercises;Contrast Bath;Scar mobilization;Passive range of motion;Parrafin;Manual Therapy   Plan assess progress and start maybe isometric   OT Home Exercise Plan see pt instruction   Consulted and Agree with Plan of Care Patient      Patient will benefit from skilled therapeutic intervention in order to  improve the following deficits and impairments:  Decreased range of motion, Impaired flexibility, Decreased coordination, Increased edema, Impaired sensation, Pain, Impaired UE functional use, Decreased scar mobility, Decreased strength, Decreased knowledge of precautions, Decreased knowledge of use of DME  Visit  Diagnosis: Pain in left hand  Stiffness of left wrist, not elsewhere classified  Muscle weakness (generalized)  Stiffness of left shoulder, not elsewhere classified    Problem List There are no active problems to display for this patient.   Rosalyn Gess OTR/L,CLT 03/20/2016, 1:49 PM  [place] [facility] [address], [place], [address] Phone: [phone]   Fax:  [phone]  Name: DORETHY LALIBERTE MRN: [id number] Date of Birth: [birth year]

## 2016-03-27 ENCOUNTER — Ambulatory Visit: Payer: 59 | Admitting: Occupational Therapy

## 2016-03-27 DIAGNOSIS — M79642 Pain in left hand: Secondary | ICD-10-CM

## 2016-03-27 DIAGNOSIS — M6281 Muscle weakness (generalized): Secondary | ICD-10-CM

## 2016-03-27 DIAGNOSIS — M25612 Stiffness of left shoulder, not elsewhere classified: Secondary | ICD-10-CM

## 2016-03-27 DIAGNOSIS — M25632 Stiffness of left wrist, not elsewhere classified: Secondary | ICD-10-CM

## 2016-03-27 NOTE — Patient Instructions (Signed)
Same HEP but hold off on manual and circular AROM  Ed on taping kinesiotape

## 2016-03-27 NOTE — Therapy (Signed)
[place] [facility] [address] [place], [address] Phone: [phone]   Fax:  [phone]  Occupational Therapy Treatment  Patient Details  Name: Megan York MRN: [id number] Date of Birth: Dec 07, 1960 Referring Provider: Rudene Christians  Encounter Date: 03/27/2016      OT End of Session - 03/27/16 1213    Visit Number 3   Number of Visits 12   Date for OT Re-Evaluation 04/27/16   OT Start Time 1025   OT Stop Time 1110   OT Time Calculation (min) 45 min   Activity Tolerance Patient tolerated treatment well   Behavior During Therapy [facility] for tasks assessed/performed      Past Medical History:  Diagnosis Date  . Arthritis   . Cancer ([place])    thyroid  . Chronic kidney disease    h/o stones  . Diabetes mellitus without complication (HCC)    diet controlled-no meds  . Family history of adverse reaction to anesthesia    pts sister hard to wake up  . History of Roux-en-Y gastric bypass   . Hypothyroidism   . Seasonal allergies     Past Surgical History:  Procedure Laterality Date  . CARPOMETACARPAL ([place]) FUSION OF THUMB Left 02/20/2016   Procedure: [facility] ARTHROPLASTY;  Surgeon: Hessie Knows, MD;  Location: [facility] ORS;  Service: Orthopedics;  Laterality: Left;  . CHOLECYSTECTOMY    . DIAGNOSTIC LAPAROSCOPY    . GASTRIC BYPASS  2002  . ROTATOR CUFF REPAIR Bilateral   . THUMB ARTHROSCOPY    . TOTAL THYROIDECTOMY      There were no vitals filed for this visit.      Subjective Assessment - 03/27/16 1156    Subjective  My thumb been sore the last 3 days- I do not know what I done- the one morning it felt so good but then later it was more sore - maybe the rainy weather for few days    Patient Stated Goals Want to get the use of my thumb back - ROM , strenght  better - and use L hand to grip, pull , lift , cut , open or do buttons    Currently in Pain? Yes   Pain Score 2    Pain Location Finger (Comment which one)    Pain Orientation Left   Pain Descriptors / Indicators Aching   Pain Type Surgical pain   Pain Onset In the past 7 days      AROM same at [facility]  fluido done    AROM for PA and RA - increase to 45 and 48 after heat Opposition - using 1 cm foam block alternate digits Hold off on circle  Wrist AROM for wrist flexion, ext, RD and UD  10 reps keep pain free   Scar mobs - using vibration -and soft tissue massage to webspace [place] for pain over thenar eminence kinesiotape done over thumb scar - 20% pull parallel to scar and across 2 pieces at 100% pull  Pt ed on doing at home  Cont to wear thumb spica  And only do AROM of thumb - hold off on manual                  OT Treatments/Exercises (OP) - 03/27/16 0001      Ultrasound   Ultrasound Location thenar eminence    Ultrasound Parameters 3.3MHZ, 0.8 intensity, 20% ,  8 min at end      LUE Fluidotherapy  Number Minutes Fluidotherapy 10 Minutes   LUE Fluidotherapy Location Hand   Comments AROM for wrist and thumb in all planes                 OT Education - 03/27/16 1212    Education provided Yes   Education Details HEP   Person(s) Educated Patient   Methods Explanation;Demonstration;Tactile cues   Comprehension Returned demonstration;Verbalized understanding          OT Short Term Goals - 03/16/16 1016      OT SHORT TERM GOAL #1   Title Pain on PRWHE improve with 20 points    Baseline 32/50 at eval on PRWHE   Time 3   Period Weeks     OT SHORT TERM GOAL #2   Title AROM in L thumb improve  for pt to do buttons, graps around empty glass   Baseline Opposition to 3rd digit , PA and RA 38, flexion of MP and IP see flowsheet   Time 4   Period Weeks   Status New     OT SHORT TERM GOAL #3   Title L wrist AROM improve to WNL compare to R to improve 20 points on functional use    Baseline Wrist AROM decrease compare to R - see flowsheet   Time 4   Period Weeks   Status New           OT Long  Term Goals - 03/16/16 1020      OT LONG TERM GOAL #1   Title Function improve using L hand by at least 25 points    Baseline at eval on PRHWE function 43/50   Time 5   Period Weeks   Status New     OT LONG TERM GOAL #2   Title Grip strength improve to 50% compare to R to carry more than 5 lbs    Baseline NT because only 3 1/2 wks s/p   Time 6   Period Weeks   Status New     OT LONG TERM GOAL #3   Title Prehension strength improve to at least 50% compare R to carry plate, turn doorknob , cut food   Baseline NT - 3 1/2 wks s/p   Time 6   Period Weeks   Status New               Plan - 03/27/16 1213    Clinical Impression Statement Pt had some increase pain or tightness more in thumb this date - pt to hold off on manaul - and work on AROM - will reassess if pain decrease to start some isometric - ROM did improve during session    Rehab Potential Good   OT Frequency 2x / week   OT Duration 6 weeks   OT Treatment/Interventions Self-care/ADL training;Fluidtherapy;Splinting;Patient/family education;Therapeutic exercises;Contrast Bath;Scar mobilization;Passive range of motion;Parrafin;Manual Therapy   Plan assess pain decrease - start isometric   OT Home Exercise Plan see pt instruction   Consulted and Agree with Plan of Care Patient      Patient will benefit from skilled therapeutic intervention in order to improve the following deficits and impairments:  Decreased range of motion, Impaired flexibility, Decreased coordination, Increased edema, Impaired sensation, Pain, Impaired UE functional use, Decreased scar mobility, Decreased strength, Decreased knowledge of precautions, Decreased knowledge of use of DME  Visit Diagnosis: Pain in left hand  Stiffness of left wrist, not elsewhere classified  Muscle weakness (generalized)  Stiffness of left shoulder, not  elsewhere classified    Problem List There are no active problems to display for this patient.   Rosalyn Gess OTR/L,CLT 03/27/2016, 12:16 PM  [place] [facility] [address] [place], [address] Phone: [phone]   Fax:  [phone]  Name: Megan York MRN: [id number] Date of Birth: 20-Dec-1960

## 2016-03-31 ENCOUNTER — Ambulatory Visit: Payer: 59 | Admitting: Occupational Therapy

## 2016-03-31 DIAGNOSIS — M79642 Pain in left hand: Secondary | ICD-10-CM

## 2016-03-31 DIAGNOSIS — M25632 Stiffness of left wrist, not elsewhere classified: Secondary | ICD-10-CM

## 2016-03-31 DIAGNOSIS — M6281 Muscle weakness (generalized): Secondary | ICD-10-CM

## 2016-03-31 DIAGNOSIS — M25612 Stiffness of left shoulder, not elsewhere classified: Secondary | ICD-10-CM

## 2016-03-31 NOTE — Therapy (Signed)
[facility] [address], [place], [address] Phone: [phone]   Fax:  [phone]  Occupational Therapy Treatment  Patient Details  Name: Megan York MRN: [id number] Date of Birth: Apr 29, 1961 Referring Provider: Rudene Christians  Encounter Date: 03/31/2016      OT End of Session - 03/31/16 1014    Visit Number 4   Number of Visits 12   Date for OT Re-Evaluation 04/27/16   OT Start Time 1002   OT Stop Time 1044   OT Time Calculation (min) 42 min   Activity Tolerance Patient tolerated treatment well   Behavior During Therapy [facility] for tasks assessed/performed      Past Medical History:  Diagnosis Date  . Arthritis   . Cancer ([place])    thyroid  . Chronic kidney disease    h/o stones  . Diabetes mellitus without complication (HCC)    diet controlled-no meds  . Family history of adverse reaction to anesthesia    pts sister hard to wake up  . History of Roux-en-Y gastric bypass   . Hypothyroidism   . Seasonal allergies     Past Surgical History:  Procedure Laterality Date  . CARPOMETACARPAL ([place]) FUSION OF THUMB Left 02/20/2016   Procedure: [facility] ARTHROPLASTY;  Surgeon: Hessie Knows, MD;  Location: [facility] ORS;  Service: Orthopedics;  Laterality: Left;  . CHOLECYSTECTOMY    . DIAGNOSTIC LAPAROSCOPY    . GASTRIC BYPASS  2002  . ROTATOR CUFF REPAIR Bilateral   . THUMB ARTHROSCOPY    . TOTAL THYROIDECTOMY      There were no vitals filed for this visit.      Subjective Assessment - 03/31/16 1010    Subjective  My thumb still bother me somw - I do not know what I did - sometimes I sit without my splint and jump up and sweeep or last week was washing some what dishes- so dont know if I did to much    Patient Stated Goals Want to get the use of my thumb back - ROM , strenght  better - and use L hand to grip, pull , lift , cut , open or do buttons    Currently in Pain? Yes   Pain Score 2    Pain Location  Finger (Comment which one)   Pain Orientation Left   Pain Descriptors / Indicators Aching   Pain Type Surgical pain   Pain Onset In the past 7 days                      OT Treatments/Exercises (OP) - 03/31/16 0001      Ultrasound   Ultrasound Location Thenar eminence   Ultrasound Parameters 3.3 mhz, 20%, 0.8 intensity - 8 min      LUE Paraffin   Number Minutes Paraffin 10 Minutes   LUE Paraffin Location Hand   Comments at [facility] to decrease pain and increase ROM       Pain increase to 3/10 after paraffin - AROM for PA and RA but pain 3/10 - attempted isometric but unable  Opposition - to base of 5th after paraffin  Wrist AROM for wrist flexion, ext, RD and UD  10 reps keep pain free  Done ice for 2 min - pain decrease  Fitted pt with [facility]MC neoprene splint - to use 1-2 hrs in few times during day- to allow more ROM but support at thumb   SOft tissue mobs  to webspace - but gentle without increase pain  [place] for pain over thenar eminence- see flowsheet            OT Education - 03/31/16 1013    Education provided Yes   Education Details HEP changes   Person(s) Educated Patient   Methods Explanation;Demonstration;Tactile cues;Verbal cues   Comprehension Verbalized understanding;Returned demonstration;Verbal cues required          OT Short Term Goals - 03/16/16 1016      OT SHORT TERM GOAL #1   Title Pain on PRWHE improve with 20 points    Baseline 32/50 at eval on PRWHE   Time 3   Period Weeks     OT SHORT TERM GOAL #2   Title AROM in L thumb improve  for pt to do buttons, graps around empty glass   Baseline Opposition to 3rd digit , PA and RA 38, flexion of MP and IP see flowsheet   Time 4   Period Weeks   Status New     OT SHORT TERM GOAL #3   Title L wrist AROM improve to WNL compare to R to improve 20 points on functional use    Baseline Wrist AROM decrease compare to R - see flowsheet   Time 4   Period Weeks   Status New            OT Long Term Goals - 03/16/16 1020      OT LONG TERM GOAL #1   Title Function improve using L hand by at least 25 points    Baseline at eval on PRHWE function 43/50   Time 5   Period Weeks   Status New     OT LONG TERM GOAL #2   Title Grip strength improve to 50% compare to R to carry more than 5 lbs    Baseline NT because only 3 1/2 wks s/p   Time 6   Period Weeks   Status New     OT LONG TERM GOAL #3   Title Prehension strength improve to at least 50% compare R to carry plate, turn doorknob , cut food   Baseline NT - 3 1/2 wks s/p   Time 6   Period Weeks   Status New               Plan - 03/31/16 1014    Clinical Impression Statement Pt since last session had some more pain at thumb - even at rest - pt do report she sometimes forget she watch tv without splint - and then jump up and do something - like sweeping or washing dishes- did fit pt with CMC neoprene splint this date to wear 1-2 hrs at time - few times during day - to  see if she maybe is fighthing the thumb spica  - and that is increasing her pain - not able to tolerate isometric strenghtning to thumb this date    Rehab Potential Good   OT Frequency 2x / week   OT Duration 6 weeks   OT Treatment/Interventions Self-care/ADL training;Fluidtherapy;Splinting;Patient/family education;Therapeutic exercises;Contrast Bath;Scar mobilization;Passive range of motion;Parrafin;Manual Therapy   Plan assess use of CMC neoprene - and pain  ?   OT Home Exercise Plan see pt instruction   Consulted and Agree with Plan of Care Patient      Patient will benefit from skilled therapeutic intervention in order to improve the following deficits and impairments:  Decreased range of motion, Impaired flexibility, Decreased coordination,  Increased edema, Impaired sensation, Pain, Impaired UE functional use, Decreased scar mobility, Decreased strength, Decreased knowledge of precautions, Decreased knowledge of use of DME  Visit  Diagnosis: Pain in left hand  Stiffness of left wrist, not elsewhere classified  Muscle weakness (generalized)  Stiffness of left shoulder, not elsewhere classified    Problem List There are no active problems to display for this patient.   Rosalyn Gess OTR/L,CLT 03/31/2016, 5:40 PM  [facility] [address] [place], [address] Phone: [phone]   Fax:  [phone]  Name: Megan York MRN: [id number] Date of Birth: [birth year]

## 2016-03-31 NOTE — Patient Instructions (Addendum)
  Same HEP - but pain free ROM - and use of CMC neoprene splint

## 2016-04-02 ENCOUNTER — Ambulatory Visit: Payer: 59 | Admitting: Occupational Therapy

## 2016-04-02 DIAGNOSIS — M6281 Muscle weakness (generalized): Secondary | ICD-10-CM

## 2016-04-02 DIAGNOSIS — M79642 Pain in left hand: Secondary | ICD-10-CM | POA: Diagnosis not present

## 2016-04-02 DIAGNOSIS — M25632 Stiffness of left wrist, not elsewhere classified: Secondary | ICD-10-CM

## 2016-04-02 NOTE — Patient Instructions (Addendum)
Upgrade HEP    isometric to thumb - in ABDADD/EXT/FLEX able to tolerate this date - 8 reps each - but need to be gentle and rest in between   Wrist Supr/pro, wrist flexion, ext, RD and UD  - cuff weight 1/2 lbs - stop prior to feeling pull in RD and wrist flexion  10 reps keep pain free    Cont with  CMC neoprene splint - to use 3 hrs on and then increaese 4 hrs 48 hrs later if no increase symptoms - rest inbetween in thumb spica  And night time thumb spica Ice at end

## 2016-04-02 NOTE — Therapy (Signed)
[facility] [address] [place], [address] Phone: [phone]   Fax:  [phone]  Occupational Therapy Treatment  Patient Details  Name: Megan York MRN: [id number] Date of Birth: 11-Feb-1961 Referring Provider: Rudene Christians  Encounter Date: 04/02/2016      OT End of Session - 04/02/16 1015    Visit Number 5   Number of Visits 12   Date for OT Re-Evaluation 04/27/16   OT Start Time 1001   OT Stop Time 1044   OT Time Calculation (min) 43 min   Activity Tolerance Patient tolerated treatment well   Behavior During Therapy [facility] for tasks assessed/performed      Past Medical History:  Diagnosis Date  . Arthritis   . Cancer ([place])    thyroid  . Chronic kidney disease    h/o stones  . Diabetes mellitus without complication (HCC)    diet controlled-no meds  . Family history of adverse reaction to anesthesia    pts sister hard to wake up  . History of Roux-en-Y gastric bypass   . Hypothyroidism   . Seasonal allergies     Past Surgical History:  Procedure Laterality Date  . CARPOMETACARPAL ([place]) FUSION OF THUMB Left 02/20/2016   Procedure: [facility] ARTHROPLASTY;  Surgeon: Hessie Knows, MD;  Location: [facility] ORS;  Service: Orthopedics;  Laterality: Left;  . CHOLECYSTECTOMY    . DIAGNOSTIC LAPAROSCOPY    . GASTRIC BYPASS  2002  . ROTATOR CUFF REPAIR Bilateral   . THUMB ARTHROSCOPY    . TOTAL THYROIDECTOMY      There were no vitals filed for this visit.      Subjective Assessment - 04/02/16 1006    Subjective  Pain is better - that soft splint helped - did about 2 hrs on and off with hard one-appt with MD tomorrow    Patient Stated Goals Want to get the use of my thumb back - ROM , strenght  better - and use L hand to grip, pull , lift , cut , open or do buttons    Currently in Pain? Yes   Pain Score 1    Pain Location Finger (Comment which one)   Pain Orientation Left   Pain Descriptors / Indicators  Aching   Pain Type Surgical pain   Pain Onset 1 to 4 weeks ago            [facility] OT Assessment - 04/02/16 0001      AROM   Left Wrist Extension 72 Degrees   Left Wrist Flexion 65 Degrees   Left Wrist Radial Deviation 24 Degrees   Left Wrist Ulnar Deviation 23 Degrees     Left Hand AROM   L Thumb MCP 0-60 50 Degrees   L Thumb IP 0-80 70 Degrees   L Thumb Radial ADduction/ABduction 0-55 41   L Thumb Palmar ADduction/ABduction 0-45 45   L Index  MCP 0-90 82 Degrees                  OT Treatments/Exercises (OP) - 04/02/16 0001      LUE Fluidotherapy   Number Minutes Fluidotherapy 10 Minutes   LUE Fluidotherapy Location Hand   Comments AROM for thumb and wrist in all planes at [facility]  decrease pain and stiffness       Measurements taken for ROM at thumb and wrist - see flowsheet   Pain improved to 1/10 - was wearing neoprene CMC splint -  2 hrs on and 2hrs thumb spica   after fluido-SOft tissue mobs to webspace - but gentle without increase pain     AROM for PA and RA  Initiated this date isometric to thumb - in ABDADD/EXT/FLEX able to tolerate this date - 8 reps each - but need to be gentle and rest in between  Add to HEP  Wrist Supr/pro, wrist flexion, ext, RD and UD  - cuff weight 1/2 lbs - stop prior to feeling pull in RD and wrist flexion  10 reps keep pain free  Add to HEP  Cont with  CMC neoprene splint - to use 3 hrs on and then increaese 4 hrs 48 hrs later if no increase symptoms - rest inbetween in thumb spica  And night time thumb spica Ice at end              OT Education - 04/02/16 1015    Education provided Yes   Education Details HEP changes for 1/2 lbs for wrist and isometric for thumb   Person(s) Educated Patient   Methods Explanation;Demonstration;Tactile cues;Verbal cues   Comprehension Verbal cues required;Returned demonstration;Verbalized understanding          OT Short Term Goals - 03/16/16 1016      OT SHORT TERM  GOAL #1   Title Pain on PRWHE improve with 20 points    Baseline 32/50 at eval on PRWHE   Time 3   Period Weeks     OT SHORT TERM GOAL #2   Title AROM in L thumb improve  for pt to do buttons, graps around empty glass   Baseline Opposition to 3rd digit , PA and RA 38, flexion of MP and IP see flowsheet   Time 4   Period Weeks   Status New     OT SHORT TERM GOAL #3   Title L wrist AROM improve to WNL compare to R to improve 20 points on functional use    Baseline Wrist AROM decrease compare to R - see flowsheet   Time 4   Period Weeks   Status New           OT Long Term Goals - 03/16/16 1020      OT LONG TERM GOAL #1   Title Function improve using L hand by at least 25 points    Baseline at eval on PRHWE function 43/50   Time 5   Period Weeks   Status New     OT LONG TERM GOAL #2   Title Grip strength improve to 50% compare to R to carry more than 5 lbs    Baseline NT because only 3 1/2 wks s/p   Time 6   Period Weeks   Status New     OT LONG TERM GOAL #3   Title Prehension strength improve to at least 50% compare R to carry plate, turn doorknob , cut food   Baseline NT - 3 1/2 wks s/p   Time 6   Period Weeks   Status New               Plan - 04/02/16 1016    Clinical Impression Statement Pt pain decrease - was using neoprene CMC splint some since last time - pt was probably fighting the thumb spica - pt on track - intiated this date isometric for thumb and 1/2 cuff weigth for wrist - pt to keep pain under 1/10  - would recommend 4 more wks for  strenghtening  and weaning out of splints    Rehab Potential Good   OT Frequency 2x / week   OT Duration 4 weeks   OT Treatment/Interventions Self-care/ADL training;Fluidtherapy;Splinting;Patient/family education;Therapeutic exercises;Contrast Bath;Scar mobilization;Passive range of motion;Parrafin;Manual Therapy   Plan assess how doing with isometric and 1/2 lbs for wrist    OT Home Exercise Plan see pt  instruction   Consulted and Agree with Plan of Care Patient      Patient will benefit from skilled therapeutic intervention in order to improve the following deficits and impairments:  Decreased range of motion, Impaired flexibility, Decreased coordination, Increased edema, Impaired sensation, Pain, Impaired UE functional use, Decreased scar mobility, Decreased strength, Decreased knowledge of precautions, Decreased knowledge of use of DME  Visit Diagnosis: Pain in left hand  Stiffness of left wrist, not elsewhere classified  Muscle weakness (generalized)    Problem List There are no active problems to display for this patient.   Rosalyn Gess OTR/L,CLT 04/02/2016, 11:43 AM  [facility] [address] [place], [address] Phone: [phone]   Fax:  [phone]  Name: Megan York MRN: [id number] Date of Birth: 12/01/1960

## 2016-04-06 ENCOUNTER — Ambulatory Visit: Payer: 59 | Admitting: Occupational Therapy

## 2016-04-06 DIAGNOSIS — M79642 Pain in left hand: Secondary | ICD-10-CM | POA: Diagnosis not present

## 2016-04-06 DIAGNOSIS — M6281 Muscle weakness (generalized): Secondary | ICD-10-CM

## 2016-04-06 DIAGNOSIS — M25632 Stiffness of left wrist, not elsewhere classified: Secondary | ICD-10-CM

## 2016-04-06 NOTE — Patient Instructions (Addendum)
Same HEP but can work on 2 sets of   isometric to thumb - in ABDADD/EXT/FLEX  Wrist Supr/pro, wrist flexion, ext, RD and UD  - Upgrade to 1 lbs weight - dumbbell  10 reps each   Taped kinestiotape to scar = 2  parallel 20% and across 2 at 100% pull  xtra provided for pt to replace over holidays

## 2016-04-06 NOTE — Therapy (Signed)
[facility] [address], [place], [address] Phone: [phone]   Fax:  [phone]  Occupational Therapy Treatment  Patient Details  Name: Megan York MRN: [id number] Date of Birth: April 14, 1961 Referring Provider: Rudene Christians  Encounter Date: 04/06/2016      OT End of Session - 04/06/16 1130    Visit Number 6   Number of Visits 12   Date for OT Re-Evaluation 04/27/16   OT Start Time 1115   OT Stop Time 1201   OT Time Calculation (min) 46 min   Activity Tolerance Patient tolerated treatment well   Behavior During Therapy [facility] for tasks assessed/performed      Past Medical History:  Diagnosis Date  . Arthritis   . Cancer ([place])    thyroid  . Chronic kidney disease    h/o stones  . Diabetes mellitus without complication (HCC)    diet controlled-no meds  . Family history of adverse reaction to anesthesia    pts sister hard to wake up  . History of Roux-en-Y gastric bypass   . Hypothyroidism   . Seasonal allergies     Past Surgical History:  Procedure Laterality Date  . CARPOMETACARPAL ([place]) FUSION OF THUMB Left 02/20/2016   Procedure: [facility] ARTHROPLASTY;  Surgeon: Hessie Knows, MD;  Location: [facility] ORS;  Service: Orthopedics;  Laterality: Left;  . CHOLECYSTECTOMY    . DIAGNOSTIC LAPAROSCOPY    . GASTRIC BYPASS  2002  . ROTATOR CUFF REPAIR Bilateral   . THUMB ARTHROSCOPY    . TOTAL THYROIDECTOMY      There were no vitals filed for this visit.      Subjective Assessment - 04/06/16 1127    Subjective  Seen the PA - and out until about 205h Dec - happy with my progress - doing okay - my splint soft one now 3 hrs and hard one 1 hr - pain is better    Patient Stated Goals Want to get the use of my thumb back - ROM , strenght  better - and use L hand to grip, pull , lift , cut , open or do buttons    Currently in Pain? Yes   Pain Score 1    Pain Location Finger (Comment which one)   Pain Orientation  Left   Pain Descriptors / Indicators Sore   Pain Type Surgical pain   Pain Onset 1 to 4 weeks ago                      OT Treatments/Exercises (OP) - 04/06/16 0001      Ultrasound   Ultrasound Location thenar eminence   Ultrasound Parameters 3.3MHZ 0.8 , 20% to decrease pain at end of session      LUE Fluidotherapy   Number Minutes Fluidotherapy 10 Minutes   LUE Fluidotherapy Location Hand   Comments AROM for thumb in all planes and writs       Pain improved to 1/10 - was wearing neoprene CMC splint -3 hrs and hard 1 hr   after fluido-SOft tissue mobs to webspace - but gentle without increase pain  Used graston tools brushing - tool nr 2 prior to ROM     AROM for PA and RA  isometric to thumb - in ABDADD/EXT/FLEX able to tolerate this date - 8 reps each - but need to be gentle and rest in between  2 sets done - pt can do 2 sets in  few days if no increase pain   Wrist Supr/pro, wrist flexion, ext, RD and UD  - cuff weight 1/2 lbs - stop prior to feeling pull in RD and wrist flexion  10 reps keep pain free  Upgrade to 1 lbs weight - dumbbell  10 reps each   Taped kinestiotape to scar = 2  parallel 20% and across 2 at 100% pull  xtra provided for pt to replace over holidays             OT Education - 04/06/16 1130    Education provided Yes   Education Details HEP changes   Person(s) Educated Patient   Methods Explanation;Demonstration;Tactile cues;Verbal cues   Comprehension Verbal cues required;Returned demonstration;Verbalized understanding          OT Short Term Goals - 03/16/16 1016      OT SHORT TERM GOAL #1   Title Pain on PRWHE improve with 20 points    Baseline 32/50 at eval on PRWHE   Time 3   Period Weeks     OT SHORT TERM GOAL #2   Title AROM in L thumb improve  for pt to do buttons, graps around empty glass   Baseline Opposition to 3rd digit , PA and RA 38, flexion of MP and IP see flowsheet   Time 4   Period Weeks    Status New     OT SHORT TERM GOAL #3   Title L wrist AROM improve to WNL compare to R to improve 20 points on functional use    Baseline Wrist AROM decrease compare to R - see flowsheet   Time 4   Period Weeks   Status New           OT Long Term Goals - 03/16/16 1020      OT LONG TERM GOAL #1   Title Function improve using L hand by at least 25 points    Baseline at eval on PRHWE function 43/50   Time 5   Period Weeks   Status New     OT LONG TERM GOAL #2   Title Grip strength improve to 50% compare to R to carry more than 5 lbs    Baseline NT because only 3 1/2 wks s/p   Time 6   Period Weeks   Status New     OT LONG TERM GOAL #3   Title Prehension strength improve to at least 50% compare R to carry plate, turn doorknob , cut food   Baseline NT - 3 1/2 wks s/p   Time 6   Period Weeks   Status New               Plan - 04/06/16 1131    Clinical Impression Statement Pt making progress in weaning out of thumb spica - and scar - tolerating strenthening isometric and 1 lbs weight for wrist - pt can upgrade to 2 sets over the holidays    Rehab Potential Good   OT Frequency 2x / week   OT Duration 4 weeks   OT Treatment/Interventions Self-care/ADL training;Fluidtherapy;Splinting;Patient/family education;Therapeutic exercises;Contrast Bath;Scar mobilization;Passive range of motion;Parrafin;Manual Therapy   Plan assess tolerance of upgrade of HEP - initiate if can putty   OT Home Exercise Plan see pt instruction   Consulted and Agree with Plan of Care Patient      Patient will benefit from skilled therapeutic intervention in order to improve the following deficits and impairments:  Decreased range of motion, Impaired  flexibility, Decreased coordination, Increased edema, Impaired sensation, Pain, Impaired UE functional use, Decreased scar mobility, Decreased strength, Decreased knowledge of precautions, Decreased knowledge of use of DME  Visit Diagnosis: Pain in  left hand  Stiffness of left wrist, not elsewhere classified  Muscle weakness (generalized)    Problem List There are no active problems to display for this patient.   Rosalyn Gess OTR/L,CLT 04/06/2016, 5:24 PM  [facility] [address] [place], [address] Phone: [phone]   Fax:  [phone]  Name: Megan York MRN: [id number] Date of Birth: 1960-06-15

## 2016-04-14 ENCOUNTER — Ambulatory Visit: Payer: 59 | Admitting: Occupational Therapy

## 2016-04-14 DIAGNOSIS — M25612 Stiffness of left shoulder, not elsewhere classified: Secondary | ICD-10-CM

## 2016-04-14 DIAGNOSIS — M79642 Pain in left hand: Secondary | ICD-10-CM

## 2016-04-14 DIAGNOSIS — M6281 Muscle weakness (generalized): Secondary | ICD-10-CM

## 2016-04-14 DIAGNOSIS — M25632 Stiffness of left wrist, not elsewhere classified: Secondary | ICD-10-CM

## 2016-04-14 NOTE — Therapy (Signed)
[place] [address] [place], [address] Phone: [phone]   Fax:  [phone]  Occupational Therapy Treatment  Patient Details  Name: Megan York MRN: [id number] Date of Birth: [birth year] Referring Provider: Rudene Christians  Encounter Date: 04/14/2016      OT End of Session - 04/14/16 1235    Visit Number 7   Number of Visits 12   Date for OT Re-Evaluation 04/27/16   OT Start Time 1212   OT Stop Time 1246   OT Time Calculation (min) 34 min   Activity Tolerance Patient tolerated treatment well   Behavior During Therapy [facility] for tasks assessed/performed      Past Medical History:  Diagnosis Date  . Arthritis   . Cancer ([place])    thyroid  . Chronic kidney disease    h/o stones  . Diabetes mellitus without complication (HCC)    diet controlled-no meds  . Family history of adverse reaction to anesthesia    pts sister hard to wake up  . History of Roux-en-Y gastric bypass   . Hypothyroidism   . Seasonal allergies     Past Surgical History:  Procedure Laterality Date  . CARPOMETACARPAL ([place]) FUSION OF THUMB Left 02/20/2016   Procedure: [facility] ARTHROPLASTY;  Surgeon: Hessie Knows, MD;  Location: [facility] ORS;  Service: Orthopedics;  Laterality: Left;  . CHOLECYSTECTOMY    . DIAGNOSTIC LAPAROSCOPY    . GASTRIC BYPASS  2002  . ROTATOR CUFF REPAIR Bilateral   . THUMB ARTHROSCOPY    . TOTAL THYROIDECTOMY      There were no vitals filed for this visit.      Subjective Assessment - 04/14/16 1217    Subjective  Using it more - can slide down my pinkie - soft splint wearing 50% on and off during day - and on soft at night time- very little pain at rest    Patient Stated Goals Want to get the use of my thumb back - ROM , strenght  better - and use L hand to grip, pull , lift , cut , open or do buttons    Currently in Pain? Yes   Pain Score 1    Pain Location Finger (Comment which one)   Pain Orientation  Left   Pain Descriptors / Indicators Aching   Pain Type Surgical pain                      OT Treatments/Exercises (OP) - 04/14/16 0001      LUE Fluidotherapy   Number Minutes Fluidotherapy 10 Minutes   LUE Fluidotherapy Location Hand;Wrist   Comments prior to upgrade of HEP to decreas stiffness and pain     Pain improved to 1/10 soreness during day  - was wearing neoprene CMC about 50% day and off    Wrist Supr/pro, wrist flexion, ext, RD and UD  10 reps keep pain free   1 lbs weight - dumbbell  2 sets   isometric to thumb - in ABD,ADD/EXT/FLEX able to tolerate this date - 8 reps each - 2 sets  Prior to fluido   After fluido  Light blue putty - grip 10 reps  Lat and 3 point grip  8 reps each   Taped kinestiotape to scar = 2  parallel 20% and across 2 at 100% pull  xtra provided for pt to replace  OT Education - 04/14/16 1235    Education provided Yes   Education Details HEp changes   Person(s) Educated Patient   Methods Explanation;Demonstration;Tactile cues;Verbal cues;Handout   Comprehension Verbal cues required;Returned demonstration;Verbalized understanding          OT Short Term Goals - 04/14/16 1802      OT SHORT TERM GOAL #1   Title Pain on PRWHE improve with 20 points    Baseline 32/50 at eval on PRWHE- improving    Time 3   Period Weeks   Status On-going     OT SHORT TERM GOAL #2   Title AROM in L thumb improve  for pt to do buttons, graps around empty glass   Status Achieved     OT SHORT TERM GOAL #3   Title L wrist AROM improve to WNL compare to R to improve 20 points on functional use    Baseline Wrist AROM improve to same than R - but PRWHE need to be done    Time 3   Period Weeks   Status On-going           OT Long Term Goals - 04/14/16 1804      OT LONG TERM GOAL #1   Title Function improve using L hand by at least 25 points    Baseline at eval on PRHWE function 43/50 - to be assess but  improving    Time 4   Period Weeks   Status On-going     OT LONG TERM GOAL #2   Title Grip strength improve to 50% compare to R to carry more than 5 lbs    Baseline NT - will be assess next week    Time 4   Period Weeks   Status On-going     OT LONG TERM GOAL #3   Title Prehension strength improve to at least 50% compare R to carry plate, turn doorknob , cut food   Baseline NT - will be assess next week    Time 4   Period Weeks   Status On-going               Plan - 04/14/16 1236    Clinical Impression Statement Pt show decrease pain and able to tolerate more strenghthening - add easy putty for grip and prehension - pt not to flexion IP and cause hyper extention at MP - pt focus to perfrom correct -and to keep pain under 1-2/10   Rehab Potential Good   OT Frequency 2x / week   OT Duration 4 weeks   OT Treatment/Interventions Self-care/ADL training;Fluidtherapy;Splinting;Patient/family education;Therapeutic exercises;Contrast Bath;Scar mobilization;Passive range of motion;Parrafin;Manual Therapy   Plan assess how doing with putty added to HEP   OT Home Exercise Plan see pt instruction   Consulted and Agree with Plan of Care Patient      Patient will benefit from skilled therapeutic intervention in order to improve the following deficits and impairments:  Decreased range of motion, Impaired flexibility, Decreased coordination, Increased edema, Impaired sensation, Pain, Impaired UE functional use, Decreased scar mobility, Decreased strength, Decreased knowledge of precautions, Decreased knowledge of use of DME  Visit Diagnosis: Pain in left hand  Stiffness of left wrist, not elsewhere classified  Muscle weakness (generalized)  Stiffness of left shoulder, not elsewhere classified    Problem List There are no active problems to display for this patient.   Rosalyn Gess OTR/L,CLT 04/14/2016, 6:05 PM  [facility] [address], [place], [address] Phone: [phone]   Fax:  [phone]  Name: Megan York MRN: [id number] Date of Birth: 12-05-1960

## 2016-04-14 NOTE — Patient Instructions (Addendum)
Wrist Supr/pro, wrist flexion, ext, RD and UD  10 reps keep pain free   1 lbs weight - dumbbell  2 sets   isometric to thumb - in ABD,ADD/EXT/FLEX  - 8 reps each - 2 sets  Add to HEP  Light blue putty - grip 10 reps  Lat  3 x 4 in log and 3 point grip rolling - 2 x thru  Taped kinestiotape to scar = 2  parallel 20% and across 2 at 100% pull  xtra provided for pt to replace

## 2016-04-16 ENCOUNTER — Ambulatory Visit: Payer: 59 | Admitting: Occupational Therapy

## 2016-04-16 DIAGNOSIS — M25632 Stiffness of left wrist, not elsewhere classified: Secondary | ICD-10-CM

## 2016-04-16 DIAGNOSIS — M79642 Pain in left hand: Secondary | ICD-10-CM | POA: Diagnosis not present

## 2016-04-16 DIAGNOSIS — M6281 Muscle weakness (generalized): Secondary | ICD-10-CM

## 2016-04-16 NOTE — Therapy (Signed)
[facility] [address] [place], [address] Phone: [phone]   Fax:  [phone]  Occupational Therapy Treatment  Patient Details  Name: Megan York MRN: [id number] Date of Birth: 1960/06/09 Referring Provider: Rudene Christians  Encounter Date: 04/16/2016      OT End of Session - 04/16/16 1159    Visit Number 8   Number of Visits 12   Date for OT Re-Evaluation 04/27/16   OT Start Time 1028   OT Stop Time 1115   OT Time Calculation (min) 47 min   Activity Tolerance Patient tolerated treatment well   Behavior During Therapy [facility] for tasks assessed/performed      Past Medical History:  Diagnosis Date  . Arthritis   . Cancer ([place])    thyroid  . Chronic kidney disease    h/o stones  . Diabetes mellitus without complication (HCC)    diet controlled-no meds  . Family history of adverse reaction to anesthesia    pts sister hard to wake up  . History of Roux-en-Y gastric bypass   . Hypothyroidism   . Seasonal allergies     Past Surgical History:  Procedure Laterality Date  . CARPOMETACARPAL ([place]) FUSION OF THUMB Left 02/20/2016   Procedure: [facility] ARTHROPLASTY;  Surgeon: Hessie Knows, MD;  Location: [facility] ORS;  Service: Orthopedics;  Laterality: Left;  . CHOLECYSTECTOMY    . DIAGNOSTIC LAPAROSCOPY    . GASTRIC BYPASS  2002  . ROTATOR CUFF REPAIR Bilateral   . THUMB ARTHROSCOPY    . TOTAL THYROIDECTOMY      There were no vitals filed for this visit.      Subjective Assessment - 04/16/16 1101    Subjective  I did the putty 2 x day -but  by yesterday afternoon it was hurting more -    Patient Stated Goals Want to get the use of my thumb back - ROM , strenght  better - and use L hand to grip, pull , lift , cut , open or do buttons    Currently in Pain? Yes   Pain Score 3    Pain Location Finger (Comment which one)   Pain Orientation Left   Pain Descriptors / Indicators Aching   Pain Type Surgical pain    Pain Onset More than a month ago                      OT Treatments/Exercises (OP) - 04/16/16 0001      Ultrasound   Ultrasound Location [place] eminence   Ultrasound Parameters 3.3MHZ at 20% 1.0 intensity , 5 min at end to decrease pain      LUE Fluidotherapy   Number Minutes Fluidotherapy 10 Minutes   LUE Fluidotherapy Location Hand;Wrist   Comments At  [facility] to decrease pain and increase tumb motion       Pain increase to 3/10 -ache  - was wearing neoprene CMC about 50% day and off   Fluido done to decrease pain   Soft tissue mobs to webspace , volar wrist and gentle stretch into thumb PA and RA  And only AROM for wrist in all planes - pain free  and then Thumb AROM in all planes - no pain   Pt can start back in 2 days if pain better  With strengthening only 1 x day    Wrist Supr/pro, wrist flexion, ext, RD and UD  10 reps keep pain free  1 lbs weight - dumbbell   isometric to thumb - in ABD,ADD/EXT/FLEX able to tolerate this date - 8 reps each - 2 sets Light blue putty - grip 10 reps  Lat and 3 point grip  8 reps each                OT Education - 04/16/16 1159    Education Details HEP changes - holding off on strengthening for 2 days    Person(s) Educated Patient   Methods Explanation;Demonstration;Tactile cues;Verbal cues   Comprehension Verbal cues required;Returned demonstration;Verbalized understanding          OT Short Term Goals - 04/14/16 1802      OT SHORT TERM GOAL #1   Title Pain on PRWHE improve with 20 points    Baseline 32/50 at eval on PRWHE- improving    Time 3   Period Weeks   Status On-going     OT SHORT TERM GOAL #2   Title AROM in L thumb improve  for pt to do buttons, graps around empty glass   Status Achieved     OT SHORT TERM GOAL #3   Title L wrist AROM improve to WNL compare to R to improve 20 points on functional use    Baseline Wrist AROM improve to same than R - but PRWHE need to be done     Time 3   Period Weeks   Status On-going           OT Long Term Goals - 04/14/16 1804      OT LONG TERM GOAL #1   Title Function improve using L hand by at least 25 points    Baseline at eval on PRHWE function 43/50 - to be assess but improving    Time 4   Period Weeks   Status On-going     OT LONG TERM GOAL #2   Title Grip strength improve to 50% compare to R to carry more than 5 lbs    Baseline NT - will be assess next week    Time 4   Period Weeks   Status On-going     OT LONG TERM GOAL #3   Title Prehension strength improve to at least 50% compare R to carry plate, turn doorknob , cut food   Baseline NT - will be assess next week    Time 4   Period Weeks   Status On-going               Plan - 04/16/16 1200    Clinical Impression Statement Pt showed increase pain since last time with putty starting - pt to hold off on putty 2 days - only AROM - then return putty only 1 x day , 1 lbs weigth for wrist - and stabilization to thumb - use ice at end    Rehab Potential Good   OT Frequency 2x / week   OT Duration 4 weeks   OT Treatment/Interventions Self-care/ADL training;Fluidtherapy;Splinting;Patient/family education;Therapeutic exercises;Contrast Bath;Scar mobilization;Passive range of motion;Parrafin;Manual Therapy   Plan Assess if pain is better with changes in HEP   OT Home Exercise Plan see pt instruction   Consulted and Agree with Plan of Care Patient      Patient will benefit from skilled therapeutic intervention in order to improve the following deficits and impairments:  Decreased range of motion, Impaired flexibility, Decreased coordination, Increased edema, Impaired sensation, Pain, Impaired UE functional use, Decreased scar mobility, Decreased strength, Decreased knowledge of precautions,  Decreased knowledge of use of DME  Visit Diagnosis: Pain in left hand  Stiffness of left wrist, not elsewhere classified  Muscle weakness  (generalized)    Problem List There are no active problems to display for this patient.   Rosalyn Gess OTR/L,CLT 04/16/2016, 1:26 PM  [place] [address], [place], [address] Phone: [phone]   Fax:  [phone]  Name: Megan York MRN: [id number] Date of Birth: Jul 14, 1960

## 2016-04-16 NOTE — Patient Instructions (Addendum)
Only AROM for 2 days Use heat and ice at end   Pt can start back in 2 days if pain better  With strengthening only 1 x day    Wrist Supr/pro, wrist flexion, ext, RD and UD  10 reps keep pain free   1 lbs weight - dumbbell   isometric to thumb - in ABD,ADD/EXT/FLEX able to tolerate this date - 8 reps each - 2 sets Light blue putty - grip 10 reps  Lat and 3 point grip  8 reps each

## 2016-04-21 ENCOUNTER — Ambulatory Visit: Payer: 59 | Attending: Orthopedic Surgery | Admitting: Occupational Therapy

## 2016-04-21 DIAGNOSIS — M6281 Muscle weakness (generalized): Secondary | ICD-10-CM | POA: Insufficient documentation

## 2016-04-21 DIAGNOSIS — M25612 Stiffness of left shoulder, not elsewhere classified: Secondary | ICD-10-CM | POA: Insufficient documentation

## 2016-04-21 DIAGNOSIS — M79642 Pain in left hand: Secondary | ICD-10-CM | POA: Insufficient documentation

## 2016-04-21 DIAGNOSIS — M25632 Stiffness of left wrist, not elsewhere classified: Secondary | ICD-10-CM | POA: Diagnosis present

## 2016-04-21 NOTE — Therapy (Signed)
[place] [facility] [address] [place], [address] Phone: [phone]   Fax:  [phone]  Occupational Therapy Treatment  Patient Details  Name: Megan York MRN: [id number] Date of Birth: Apr 12, 1961 Referring Provider: Rudene Christians  Encounter Date: 04/21/2016      OT End of Session - 04/21/16 1350    Visit Number 9   Number of Visits 12   Date for OT Re-Evaluation 04/27/16   OT Start Time 1115   OT Stop Time 1201   OT Time Calculation (min) 46 min   Activity Tolerance Patient tolerated treatment well   Behavior During Therapy [facility] for tasks assessed/performed      Past Medical History:  Diagnosis Date  . Arthritis   . Cancer ([place])    thyroid  . Chronic kidney disease    h/o stones  . Diabetes mellitus without complication (HCC)    diet controlled-no meds  . Family history of adverse reaction to anesthesia    pts sister hard to wake up  . History of Roux-en-Y gastric bypass   . Hypothyroidism   . Seasonal allergies     Past Surgical History:  Procedure Laterality Date  . CARPOMETACARPAL ([place]) FUSION OF THUMB Left 02/20/2016   Procedure: [facility] ARTHROPLASTY;  Surgeon: Hessie Knows, MD;  Location: [facility] ORS;  Service: Orthopedics;  Laterality: Left;  . CHOLECYSTECTOMY    . DIAGNOSTIC LAPAROSCOPY    . GASTRIC BYPASS  2002  . ROTATOR CUFF REPAIR Bilateral   . THUMB ARTHROSCOPY    . TOTAL THYROIDECTOMY      There were no vitals filed for this visit.      Subjective Assessment - 04/21/16 1341    Subjective  Doing good - no pain increase - doing putty and 1 lbs weight 2 x day - one set - wearing soft splint - but trying not to sleep with anything and doing okay    Patient Stated Goals Want to get the use of my thumb back - ROM , strenght  better - and use L hand to grip, pull , lift , cut , open or do buttons    Currently in Pain? Yes   Pain Score 1    Pain Location Finger (Comment which one)   Pain  Orientation Left   Pain Descriptors / Indicators Sore   Pain Type Surgical pain   Pain Onset More than a month ago            [facility] OT Assessment - 04/21/16 0001      Strength   Right Hand Grip (lbs) 58   Right Hand Lateral Pinch 13 lbs   Right Hand 3 Point Pinch 15 lbs   Left Hand Grip (lbs) 24   Left Hand Lateral Pinch 6 lbs   Left Hand 3 Point Pinch 4 lbs                  OT Treatments/Exercises (OP) - 04/21/16 0001      LUE Fluidotherapy   Number Minutes Fluidotherapy 10 Minutes   LUE Fluidotherapy Location Hand;Wrist   Comments [facility]OM [facility] to decrease pain and stiffness in thumb and wrist       Fluido  Wrist Supr/pro, wrist flexion, ext, RD and UD  10 reps keep pain free  16 oz hammer - add to HEP to increase weight little No pain  Fabricated thumb MP hyper extention block splint to wear during putty prehension  Exercises  and to wean into out of [place] neoprene  During use of thumb activities to decrease pain and increase stability  Pt report less pain and increase strength doing putty lat and 3 point  Pt ed on using and wearing  Lat grip and 3 point increase 1-2 lbs with wearing of it   isometric to thumb - in ABD,ADD/EXT/FLEX able to tolerate this date - 8 reps each - 2 sets Upgrade to teal - grip 10 reps  Lat and 3 point grip 12 reps   one set- 2 x day  If pain free - increase to 2 sets with splint on one of sets by Friday                  OT Education - 04/21/16 1350    Education provided Yes   Education Details HEP changes and use of MP hyper extention block splint    Person(s) Educated Patient   Methods Explanation;Demonstration;Tactile cues;Verbal cues   Comprehension Verbal cues required;Returned demonstration;Verbalized understanding          OT Short Term Goals - 04/14/16 1802      OT SHORT TERM GOAL #1   Title Pain on PRWHE improve with 20 points    Baseline 32/50 at eval on PRWHE- improving    Time 3    Period Weeks   Status On-going     OT SHORT TERM GOAL #2   Title AROM in L thumb improve  for pt to do buttons, graps around empty glass   Status Achieved     OT SHORT TERM GOAL #3   Title L wrist AROM improve to WNL compare to R to improve 20 points on functional use    Baseline Wrist AROM improve to same than R - but PRWHE need to be done    Time 3   Period Weeks   Status On-going           OT Long Term Goals - 04/14/16 1804      OT LONG TERM GOAL #1   Title Function improve using L hand by at least 25 points    Baseline at eval on PRHWE function 43/50 - to be assess but improving    Time 4   Period Weeks   Status On-going     OT LONG TERM GOAL #2   Title Grip strength improve to 50% compare to R to carry more than 5 lbs    Baseline NT - will be assess next week    Time 4   Period Weeks   Status On-going     OT LONG TERM GOAL #3   Title Prehension strength improve to at least 50% compare R to carry plate, turn doorknob , cut food   Baseline NT - will be assess next week    Time 4   Period Weeks   Status On-going               Plan - 04/21/16 1350    Clinical Impression Statement Pt show increase strength and decrease pain at thumb - weaning out of CMC neoprene splint more - upgrade this date putty - and pt can use 16 oz hammer for wrist exercises    Rehab Potential Good   OT Frequency 2x / week   OT Duration 4 weeks   OT Treatment/Interventions Self-care/ADL training;Fluidtherapy;Splinting;Patient/family education;Therapeutic exercises;Contrast Bath;Scar mobilization;Passive range of motion;Parrafin;Manual Therapy   Plan progress - and do PRWHE   OT Home Exercise Plan  see pt instruction   Consulted and Agree with Plan of Care Patient      Patient will benefit from skilled therapeutic intervention in order to improve the following deficits and impairments:  Decreased range of motion, Impaired flexibility, Decreased coordination, Increased edema,  Impaired sensation, Pain, Impaired UE functional use, Decreased scar mobility, Decreased strength, Decreased knowledge of precautions, Decreased knowledge of use of DME  Visit Diagnosis: Pain in left hand  Stiffness of left wrist, not elsewhere classified  Muscle weakness (generalized)    Problem List There are no active problems to display for this patient.   Rosalyn Gess OTR/L,CLT 04/21/2016, 1:52 PM  [place] [facility] [address] [place], [address] Phone: [phone]   Fax:  [phone]  Name: PRANJAL MIGLIACCIO MRN: [id number] Date of Birth: 05-09-1961

## 2016-04-21 NOTE — Patient Instructions (Signed)
   Wrist Supr/pro, wrist flexion, ext, RD and UD  10 reps keep pain free  16 oz hammer  Wear  thumb MP hyper extention block splint during putty prehension  Exercises and to wean into out of CMC neoprene     isometric to thumb - in ABD,ADD/EXT/FLEX able to tolerate this date - 8 reps each - 2 sets Upgrade to teal - grip 10 reps  Lat and 3 point grip 12 reps   one set- 2 x day  If pain free - increase to 2 sets with splint on one of sets by Friday

## 2016-04-22 ENCOUNTER — Ambulatory Visit: Payer: 59 | Admitting: Occupational Therapy

## 2016-04-28 ENCOUNTER — Ambulatory Visit: Payer: 59 | Admitting: Occupational Therapy

## 2016-04-30 ENCOUNTER — Ambulatory Visit: Payer: 59 | Admitting: Occupational Therapy

## 2016-04-30 DIAGNOSIS — M25632 Stiffness of left wrist, not elsewhere classified: Secondary | ICD-10-CM

## 2016-04-30 DIAGNOSIS — M25612 Stiffness of left shoulder, not elsewhere classified: Secondary | ICD-10-CM

## 2016-04-30 DIAGNOSIS — M6281 Muscle weakness (generalized): Secondary | ICD-10-CM

## 2016-04-30 DIAGNOSIS — M79642 Pain in left hand: Secondary | ICD-10-CM

## 2016-04-30 NOTE — Patient Instructions (Addendum)
  Wrist Supr/pro, wrist flexion, ext, RD and UD  10 reps keep pain free  16 oz hammer    thumb MP hyper extention block splint to wear during putty prehension exercises and to wean into out of CMC neoprene  During use of thumb activities to decrease pain and increase stability  Pt report less pain and increase strength with use of MP hyperextention block splint  cont with  teal putty- grip 10 reps  Lat and 3 point grip 12 reps   one set- 2 x day  If pain free - increase to 2 sets with splint on  Isometric for thumb PA and RA  10 reps each

## 2016-04-30 NOTE — Therapy (Signed)
[facility] [address], [place], [address] Phone: [phone]   Fax:  [phone]  Occupational Therapy Treatment  Patient Details  Name: Megan York MRN: [id number] Date of Birth: [birth year] Referring Provider: Rudene Christians  Encounter Date: 04/30/2016      OT End of Session - 04/30/16 1022    Visit Number 10   Number of Visits 14   Date for OT Re-Evaluation 05/28/16   OT Start Time 1000   OT Stop Time 1047   OT Time Calculation (min) 47 min   Activity Tolerance Patient tolerated treatment well   Behavior During Therapy [facility] for tasks assessed/performed      Past Medical History:  Diagnosis Date  . Arthritis   . Cancer ([place])    thyroid  . Chronic kidney disease    h/o stones  . Diabetes mellitus without complication (HCC)    diet controlled-no meds  . Family history of adverse reaction to anesthesia    pts sister hard to wake up  . History of Roux-en-Y gastric bypass   . Hypothyroidism   . Seasonal allergies     Past Surgical History:  Procedure Laterality Date  . CARPOMETACARPAL ([place]) FUSION OF THUMB Left 02/20/2016   Procedure: [facility] ARTHROPLASTY;  Surgeon: Hessie Knows, MD;  Location: [facility] ORS;  Service: Orthopedics;  Laterality: Left;  . CHOLECYSTECTOMY    . DIAGNOSTIC LAPAROSCOPY    . GASTRIC BYPASS  2002  . ROTATOR CUFF REPAIR Bilateral   . THUMB ARTHROSCOPY    . TOTAL THYROIDECTOMY      There were no vitals filed for this visit.      Subjective Assessment - 04/30/16 1003    Subjective  My thumb was just ache - very much - but it was so cold yesterday and snowed this past weekend - and my dog is lost- trying to find him - in the cold - I also got a head cold - she just not feeling good    Patient Stated Goals Want to get the use of my thumb back - ROM , strenght  better - and use L hand to grip, pull , lift , cut , open or do buttons    Currently in Pain? Yes   Pain Score 1     Pain Location Hand   Pain Orientation Left   Pain Descriptors / Indicators Aching   Pain Type Surgical pain            [facility] OT Assessment - 04/30/16 0001      Strength   Right Hand Grip (lbs) 58   Right Hand Lateral Pinch 13 lbs   Right Hand 3 Point Pinch 15 lbs   Left Hand Grip (lbs) 35   Left Hand Lateral Pinch 8 lbs   Left Hand 3 Point Pinch 4 lbs                  OT Treatments/Exercises (OP) - 04/30/16 0001      LUE Fluidotherapy   Number Minutes Fluidotherapy 10 Minutes   LUE Fluidotherapy Location Hand;Wrist   Comments at [facility] to decrease pain and stiffness       Grip strength assess and prehension  - see flowsheet AROM for MP and IP thumb improved Fluido to decrease pain   Wrist Supr/pro, wrist flexion, ext, RD and UD  10 reps keep pain free  16 oz hammer  No pain  thumb MP hyper extention block  splint to wear during putty prehension exercises and to wean into out of CMC neoprene  During use of thumb activities to decrease pain and increase stability  Pt report less pain and increase strength with use of MP hyperextention block splint  cont with  teal putty- grip 10 reps  Lat and 3 point grip 12 reps   one set- 2 x day  If pain free - increase to 2 sets with splint on             OT Education - 04/30/16 1022    Education provided Yes   Education Details HEP changes   Person(s) Educated Patient   Methods Explanation;Demonstration;Tactile cues;Verbal cues   Comprehension Verbal cues required;Returned demonstration;Verbalized understanding          OT Short Term Goals - 04/14/16 1802      OT SHORT TERM GOAL #1   Title Pain on PRWHE improve with 20 points    Baseline 32/50 at eval on PRWHE- improving    Time 3   Period Weeks   Status On-going     OT SHORT TERM GOAL #2   Title AROM in L thumb improve  for pt to do buttons, graps around empty glass   Status Achieved     OT SHORT TERM GOAL #3   Title L wrist AROM  improve to WNL compare to R to improve 20 points on functional use    Baseline Wrist AROM improve to same than R - but PRWHE need to be done    Time 3   Period Weeks   Status On-going           OT Long Term Goals - 04/14/16 1804      OT LONG TERM GOAL #1   Title Function improve using L hand by at least 25 points    Baseline at eval on PRHWE function 43/50 - to be assess but improving    Time 4   Period Weeks   Status On-going     OT LONG TERM GOAL #2   Title Grip strength improve to 50% compare to R to carry more than 5 lbs    Baseline NT - will be assess next week    Time 4   Period Weeks   Status On-going     OT LONG TERM GOAL #3   Title Prehension strength improve to at least 50% compare R to carry plate, turn doorknob , cut food   Baseline NT - will be assess next week    Time 4   Period Weeks   Status On-going               Plan - 04/30/16 1023    Clinical Impression Statement Pt show increase grip and lat grip - increase MP and IP flexion of thumb - had increase pain yesterday - but pt was sick , stress from dog lost , and weather was very cold since this weekend    Rehab Potential Good   OT Frequency 1x / week   OT Duration 4 weeks   OT Treatment/Interventions Self-care/ADL training;Fluidtherapy;Splinting;Patient/family education;Therapeutic exercises;Contrast Bath;Scar mobilization;Passive range of motion;Parrafin;Manual Therapy   Plan PRWHE to be done -and progress ROM and grip    OT Home Exercise Plan see pt instruction   Consulted and Agree with Plan of Care Patient      Patient will benefit from skilled therapeutic intervention in order to improve the following deficits and impairments:  Decreased range of  motion, Impaired flexibility, Decreased coordination, Increased edema, Impaired sensation, Pain, Impaired UE functional use, Decreased scar mobility, Decreased strength, Decreased knowledge of precautions, Decreased knowledge of use of  DME  Visit Diagnosis: Pain in left hand  Stiffness of left wrist, not elsewhere classified  Muscle weakness (generalized)  Stiffness of left shoulder, not elsewhere classified    Problem List There are no active problems to display for this patient.   Rosalyn Gess OTR/L,CLT 04/30/2016, 2:53 PM  [facility] [address], [place], [address] Phone: [phone]   Fax:  [phone]  Name: Megan York MRN: [id number] Date of Birth: 1960/11/25

## 2016-05-05 ENCOUNTER — Ambulatory Visit: Payer: 59 | Admitting: Occupational Therapy

## 2016-05-05 DIAGNOSIS — M79642 Pain in left hand: Secondary | ICD-10-CM

## 2016-05-05 DIAGNOSIS — M25632 Stiffness of left wrist, not elsewhere classified: Secondary | ICD-10-CM

## 2016-05-05 DIAGNOSIS — M6281 Muscle weakness (generalized): Secondary | ICD-10-CM

## 2016-05-05 NOTE — Patient Instructions (Addendum)
Add to HEP FMC/dexterity done - thumb <> fingers for cards, golf ball opposition to all digits<>thumb , and then 1st 2 , and last 2 digits alternate Tripod grip moving up and down large pen    Wrist Supr/pro, wrist flexion, ext, RD and UD  10 reps keep pain free 2lbs No pain   cont with  teal putty- grip 10 reps  Lat and 3 point grip 12 reps  one set- 2 x day  Add xtra thumb lateral grip , and  3 point ( 3 x 3 but larger roll)  Rubber band add for PA and RA - 10 reps each   Pt to do 2 lbs weight and rubber band 1 x day for 3-4 days - and if not increase pain increase to 2 x day  Putty 2 x day and dexterity

## 2016-05-05 NOTE — Therapy (Signed)
[facility] [address] [place], [address] Phone: [phone]   Fax:  [phone]  Occupational Therapy Treatment  Patient Details  Name: Megan York MRN: [id number] Date of Birth: [birth year] Referring Provider: Rudene Christians  Encounter Date: 05/05/2016      OT End of Session - 05/05/16 1021    Visit Number 11   Number of Visits 14   Date for OT Re-Evaluation 05/28/16   OT Start Time 0950   OT Stop Time 1040   OT Time Calculation (min) 50 min   Activity Tolerance Patient tolerated treatment well   Behavior During Therapy [facility] for tasks assessed/performed      Past Medical History:  Diagnosis Date  . Arthritis   . Cancer ([place])    thyroid  . Chronic kidney disease    h/o stones  . Diabetes mellitus without complication (HCC)    diet controlled-no meds  . Family history of adverse reaction to anesthesia    pts sister hard to wake up  . History of Roux-en-Y gastric bypass   . Hypothyroidism   . Seasonal allergies     Past Surgical History:  Procedure Laterality Date  . CARPOMETACARPAL ([place]) FUSION OF THUMB Left 02/20/2016   Procedure: [facility] ARTHROPLASTY;  Surgeon: Hessie Knows, MD;  Location: [facility] ORS;  Service: Orthopedics;  Laterality: Left;  . CHOLECYSTECTOMY    . DIAGNOSTIC LAPAROSCOPY    . GASTRIC BYPASS  2002  . ROTATOR CUFF REPAIR Bilateral   . THUMB ARTHROSCOPY    . TOTAL THYROIDECTOMY      There were no vitals filed for this visit.      Subjective Assessment - 05/05/16 1016    Subjective  I can tell difference in my hand - little more strength- but still coordination and strength small pinch grip is hard   Patient Stated Goals Want to get the use of my thumb back - ROM , strenght  better - and use L hand to grip, pull , lift , cut , open or do buttons    Currently in Pain? Yes   Pain Score 1    Pain Location Hand   Pain Orientation Left   Pain Descriptors / Indicators Aching    Pain Type Surgical pain   Pain Onset More than a month ago            [facility] OT Assessment - 05/05/16 0001      Strength   Left Hand Grip (lbs) 40   Left Hand Lateral Pinch 8 lbs   Left Hand 3 Point Pinch 5 lbs                  OT Treatments/Exercises (OP) - 05/05/16 0001      LUE Fluidotherapy   Number Minutes Fluidotherapy 10 Minutes   LUE Fluidotherapy Location Hand;Wrist   Comments decrease tightness , soreness , stiffness  prior to ther ex       Grip strength assess and prehension  - see flowsheet - progressed 16 lbs in grip in 2 wks - lat and 3 point still decrease and some pain   FMC/dexterity done - thumb <> fingers for cards, golf ball opposition to all digits<>thumb , and then 1st 2 , and last 2 digits alternate Tripod grip moving up and down large pen   Fluido to decrease pain  Soft tissue mobs to webspace with PA and RA thumb stretch  Scar mobs - done this  date suing xtractor and vibration - pt show decrease hyper extention at MP of thumb during thumb PA and RA strengthening exercises   Wrist Supr/pro, wrist flexion, ext, RD and UD  10 reps keep pain free 2lbs No pain   cont with  teal putty- grip 10 reps  Lat and 3 point grip 12 reps  one set- 2 x day  Add xtra thumb lateral grip , and  3 point ( 3 x 3 but larger roll)  Rubber band add for PA and RA - 10 reps each   Pt to do 2 lbs weight and rubber band 1 x day for 3-4 days - and if not increase pain increase to 2 x day  Putty 2 x day and dexterity            OT Education - 05/05/16 1020    Education provided Yes   Education Details HEP changes   Person(s) Educated Patient   Methods Explanation;Demonstration;Tactile cues;Verbal cues   Comprehension Returned demonstration;Verbal cues required;Verbalized understanding          OT Short Term Goals - 04/14/16 1802      OT SHORT TERM GOAL #1   Title Pain on PRWHE improve with 20 points    Baseline 32/50 at eval on PRWHE-  improving    Time 3   Period Weeks   Status On-going     OT SHORT TERM GOAL #2   Title AROM in L thumb improve  for pt to do buttons, graps around empty glass   Status Achieved     OT SHORT TERM GOAL #3   Title L wrist AROM improve to WNL compare to R to improve 20 points on functional use    Baseline Wrist AROM improve to same than R - but PRWHE need to be done    Time 3   Period Weeks   Status On-going           OT Long Term Goals - 04/14/16 1804      OT LONG TERM GOAL #1   Title Function improve using L hand by at least 25 points    Baseline at eval on PRHWE function 43/50 - to be assess but improving    Time 4   Period Weeks   Status On-going     OT LONG TERM GOAL #2   Title Grip strength improve to 50% compare to R to carry more than 5 lbs    Baseline NT - will be assess next week    Time 4   Period Weeks   Status On-going     OT LONG TERM GOAL #3   Title Prehension strength improve to at least 50% compare R to carry plate, turn doorknob , cut food   Baseline NT - will be assess next week    Time 4   Period Weeks   Status On-going               Plan - 05/05/16 1047    Clinical Impression Statement Pt show increase grip again - lat adn 3 point same - but able to use it more - add dexterity this date to pt HEP - and upgrade to 2 lbs for weight - pt still has increase pain if over do - increase HEP and ther ex slow by steady - pt  to see surgeon tomorrow - pt to discuss if can return to work 1/2 days for 3 wks - at 3 months post  op     Rehab Potential Good   OT Frequency 1x / week   OT Duration 4 weeks   OT Treatment/Interventions Self-care/ADL training;Fluidtherapy;Splinting;Patient/family education;Therapeutic exercises;Contrast Bath;Scar mobilization;Passive range of motion;Parrafin;Manual Therapy   Plan Results from MD visit - how did with upgrades on [place] see pt instruction   Consulted and Agree with Plan of Care Patient       Patient will benefit from skilled therapeutic intervention in order to improve the following deficits and impairments:  Decreased range of motion, Impaired flexibility, Decreased coordination, Increased edema, Impaired sensation, Pain, Impaired UE functional use, Decreased scar mobility, Decreased strength, Decreased knowledge of precautions, Decreased knowledge of use of DME  Visit Diagnosis: Pain in left hand  Stiffness of left wrist, not elsewhere classified  Muscle weakness (generalized)    Problem List There are no active problems to display for this patient.   Rosalyn Gess OTR/L,CLT 05/05/2016, 10:50 AM  [facility] [address], [place], [address] Phone: [phone]   Fax:  [phone]  Name: Megan York MRN: [id number] Date of Birth: Nov 10, 1960

## 2016-05-13 ENCOUNTER — Ambulatory Visit: Payer: 59 | Admitting: Occupational Therapy

## 2016-05-13 DIAGNOSIS — M79642 Pain in left hand: Secondary | ICD-10-CM | POA: Diagnosis not present

## 2016-05-13 DIAGNOSIS — M25632 Stiffness of left wrist, not elsewhere classified: Secondary | ICD-10-CM

## 2016-05-13 DIAGNOSIS — M6281 Muscle weakness (generalized): Secondary | ICD-10-CM

## 2016-05-13 NOTE — Therapy (Signed)
[facility] [address] [place], [address] Phone: [phone]   Fax:  [phone]  Occupational Therapy Treatment  Patient Details  Name: Megan York MRN: [id number] Date of Birth: 07-26-1960 Referring Provider: Rudene Christians  Encounter Date: 05/13/2016      OT End of Session - 05/13/16 0956    Visit Number 12   Number of Visits 16   Date for OT Re-Evaluation 06/10/16   OT Start Time 0940   OT Stop Time 1035   OT Time Calculation (min) 55 min   Activity Tolerance Patient tolerated treatment well   Behavior During Therapy [facility] for tasks assessed/performed      Past Medical History:  Diagnosis Date  . Arthritis   . Cancer ([place])    thyroid  . Chronic kidney disease    h/o stones  . Diabetes mellitus without complication (HCC)    diet controlled-no meds  . Family history of adverse reaction to anesthesia    pts sister hard to wake up  . History of Roux-en-Y gastric bypass   . Hypothyroidism   . Seasonal allergies     Past Surgical History:  Procedure Laterality Date  . CARPOMETACARPAL ([place]) FUSION OF THUMB Left 02/20/2016   Procedure: [facility] ARTHROPLASTY;  Surgeon: Hessie Knows, MD;  Location: [facility] ORS;  Service: Orthopedics;  Laterality: Left;  . CHOLECYSTECTOMY    . DIAGNOSTIC LAPAROSCOPY    . GASTRIC BYPASS  2002  . ROTATOR CUFF REPAIR Bilateral   . THUMB ARTHROSCOPY    . TOTAL THYROIDECTOMY      There were no vitals filed for this visit.      Subjective Assessment - 05/13/16 0942    Subjective  Seen MD - he looked at your note and going to keep me out another month - pain not as intense maybe - depend what I do - maybe more strength    Patient Stated Goals Want to get the use of my thumb back - ROM , strenght  better - and use L hand to grip, pull , lift , cut , open or do buttons    Currently in Pain? Yes   Pain Score 1    Pain Location Hand   Pain Orientation Left   Pain Descriptors /  Indicators Aching   Pain Type Surgical pain   Pain Onset More than a month ago                      OT Treatments/Exercises (OP) - 05/13/16 0001      LUE Fluidotherapy   Number Minutes Fluidotherapy 10 Minutes   LUE Fluidotherapy Location Hand;Wrist   Comments decrease tigthness and stiffness at [facility] - decrease pain       Fluido to decrease pain  Soft tissue mobs to webspace with PA and RA thumb stretch  - using graston tools for radial wrist, volar palm and wrist using sweeping and scooping - using tools nr 2 and 4 Scar mobs - done this date using vibration - pt show decrease hyper extention at MP of thumb during thumb PA and RA strengthening exercises  And less tightness during exercises  cont with 2 lbs weight for wrist at home   teal putty- grip 10 reps  Lat and 3 point grip 12 reps  one set - but had increase pain  Pt to go back to light blue - no splint on and do 2 sets of 12  reps - and add pulling and twising - work at home on 2 x day - 2sets - and no splint  Work on endurance and no increase pain  Can increase back to teal in 5 days   Rubber band for PA and RA - 10 reps each  - add sustained for 10 sec if easy Done PRWHE with pt - Pain 19/50 and  Function 24/50              OT Education - 05/13/16 0956    Education provided Yes   Education Details HEP reviewed    Person(s) Educated Patient   Methods Explanation;Demonstration;Tactile cues;Verbal cues   Comprehension Verbal cues required;Returned demonstration;Verbalized understanding          OT Short Term Goals - 05/13/16 1048      OT SHORT TERM GOAL #1   Title Pain on PRWHE improve with 20 points    Baseline 32/50 at eval on PRWHE- improve to 19/50   Time 3   Period Weeks   Status On-going     OT SHORT TERM GOAL #2   Title AROM in L thumb improve  for pt to do buttons, graps around empty glass   Baseline progress in ROM - but did not use it yet - need to try for next visit    Time 4   Period Weeks   Status On-going     OT SHORT TERM GOAL #3   Title L wrist AROM improve to WNL compare to R to improve 20 points on functional use    Baseline Wrist AROM improve to same than R - but PRWHE function improve 10 points    Time 3   Period Weeks   Status On-going           OT Long Term Goals - 05/13/16 1049      OT LONG TERM GOAL #1   Title Function improve using L hand by at least 25 points    Baseline at eval on PRHWE function 43/50 - improve to 24/50   Time 4   Period Weeks   Status On-going     OT LONG TERM GOAL #2   Title Grip strength improve to 50% compare to R to carry more than 5 lbs    Baseline grip improve to 40 but still some pain - can carry 5 lbs - some pain with 8 lbs    Status Achieved     OT LONG TERM GOAL #3   Title Prehension strength improve to at least 50% compare R to carry plate, turn doorknob , cut food   Baseline improving - lat 8 lbs and 3point 5 lbs - but painfull   Time 4   Period Weeks   Status On-going               Plan - 05/13/16 0957    Clinical Impression Statement Pt seen surgeon - pt to cont 1 x wk for 4 wks - pt to work this next week on endurance with easier putty - but more reps and sets - and pain free - pt showed great progress in pain since starte , strength and  ROM - but function still afraid to use - pt  to use hand more the next week and report back    Rehab Potential Good   OT Frequency 1x / week   OT Duration 4 weeks   OT Treatment/Interventions Self-care/ADL training;Fluidtherapy;Splinting;Patient/family education;Therapeutic exercises;Contrast Bath;Scar mobilization;Passive range of motion;Parrafin;Manual  Therapy   Plan assess progress with changes made in HEP    OT Home Exercise Plan see pt instruction   Consulted and Agree with Plan of Care Patient      Patient will benefit from skilled therapeutic intervention in order to improve the following deficits and impairments:  Decreased range of  motion, Impaired flexibility, Decreased coordination, Increased edema, Impaired sensation, Pain, Impaired UE functional use, Decreased scar mobility, Decreased strength, Decreased knowledge of precautions, Decreased knowledge of use of DME  Visit Diagnosis: Stiffness of left wrist, not elsewhere classified  Muscle weakness (generalized)  Pain in left hand    Problem List There are no active problems to display for this patient.   Rosalyn Gess OTR/L,CLT  05/13/2016, 10:51 AM  [facility] [address] [place], [address] Phone: [phone]   Fax:  [phone]  Name: Megan York MRN: [id number] Date of Birth: [birth year]

## 2016-05-13 NOTE — Patient Instructions (Addendum)
  cont with 2 lbs weight for wrist at home   teal putty- grip 10 reps  Lat and 3 point grip 12 reps  one set - but had increase pain  Pt to go back to light blue - no splint on and do 2 sets of 12 reps - and add pulling and twising - work at home on 2 x day - 2sets - and no splint  Work on endurance and no increase pain  Can increase back to teal in 5 days   Rubber band for PA and RA - 10 reps each  - add sustained for 10 sec if easy Done PRWHE with pt - Pain 19/50 and  Function 24/50

## 2016-05-19 ENCOUNTER — Ambulatory Visit: Payer: 59 | Attending: Orthopedic Surgery | Admitting: Occupational Therapy

## 2016-05-19 DIAGNOSIS — M6281 Muscle weakness (generalized): Secondary | ICD-10-CM | POA: Insufficient documentation

## 2016-05-19 DIAGNOSIS — M79642 Pain in left hand: Secondary | ICD-10-CM | POA: Diagnosis present

## 2016-05-19 DIAGNOSIS — M25632 Stiffness of left wrist, not elsewhere classified: Secondary | ICD-10-CM | POA: Diagnosis present

## 2016-05-19 NOTE — Patient Instructions (Signed)
cont with 2 lbs weight for wrist at home   teal putty- grip 10 reps  Lat and 3 point grip 12 reps  one set Mix 1/2 of teal to light blue - no splint on and do 2 sets of 12 reps -  pulling and twising - work at home on 2 x day - 2sets - and no splint  And some extention /pushing simulated into putty for 2nd and thumb  12 reps each  Work on endurance and no increase pain  Can increase  to teal in 3-4 days for all exercises if no increase pain

## 2016-05-19 NOTE — Therapy (Signed)
[place] [facility] [address], [place], [address] Phone: [phone]   Fax:  [phone]  Occupational Therapy Treatment  Patient Details  Name: Megan York MRN: [id number] Date of Birth: [birth year] Referring Provider: Rudene Christians  Encounter Date: 05/19/2016      OT End of Session - 05/19/16 1214    Visit Number 13   Number of Visits 16   Date for OT Re-Evaluation 06/10/16   OT Start Time 1115   OT Stop Time 1159   OT Time Calculation (min) 44 min   Activity Tolerance Patient tolerated treatment well   Behavior During Therapy [facility] for tasks assessed/performed      Past Medical History:  Diagnosis Date  . Arthritis   . Cancer ([place])    thyroid  . Chronic kidney disease    h/o stones  . Diabetes mellitus without complication (HCC)    diet controlled-no meds  . Family history of adverse reaction to anesthesia    pts sister hard to wake up  . History of Roux-en-Y gastric bypass   . Hypothyroidism   . Seasonal allergies     Past Surgical History:  Procedure Laterality Date  . CARPOMETACARPAL ([place]) FUSION OF THUMB Left 02/20/2016   Procedure: [facility] ARTHROPLASTY;  Surgeon: Hessie Knows, MD;  Location: [facility] ORS;  Service: Orthopedics;  Laterality: Left;  . CHOLECYSTECTOMY    . DIAGNOSTIC LAPAROSCOPY    . GASTRIC BYPASS  2002  . ROTATOR CUFF REPAIR Bilateral   . THUMB ARTHROSCOPY    . TOTAL THYROIDECTOMY      There were no vitals filed for this visit.      Subjective Assessment - 05/19/16 1121    Subjective  Splint only when going out - no splint at home - it is getting better I think - still hard somethings that is heavy or that is small and heavy - feel pull with pushing index when doing seatbelt    Patient Stated Goals Want to get the use of my thumb back - ROM , strenght  better - and use L hand to grip, pull , lift , cut , open or do buttons    Currently in Pain? Yes   Pain Score 1    Pain Location  Hand   Pain Orientation Left   Pain Descriptors / Indicators Aching   Pain Type Surgical pain            [facility] OT Assessment - 05/19/16 0001      Strength   Right Hand Grip (lbs) 58   Right Hand Lateral Pinch 13 lbs   Right Hand 3 Point Pinch 15 lbs   Left Hand Grip (lbs) 43   Left Hand Lateral Pinch 11 lbs   Left Hand 3 Point Pinch 9 lbs                  OT Treatments/Exercises (OP) - 05/19/16 0001      LUE Fluidotherapy   Number Minutes Fluidotherapy 8 Minutes   LUE Fluidotherapy Location Hand   Comments decrease tightness in thumb and 2nd digit at [facility]     Measure grip and prehension - see flowsheet Scar massage done - soft tissue mobs with webspace PA and RA stretches - and doing vibration over both scars    cont with 2 lbs weight for wrist at home   teal putty- grip 10 reps  Lat and 3 point grip 12 reps  one set  Mix 1/2 of teal to light blue - no splint on and do 2 sets of 12 reps -  pulling and twising - work at home on 2 x day - 2sets - and no splint  And some extention /pushing simulated into putty for 2nd and thumb  12 reps each  Work on endurance and no increase pain  Can increase  to teal in 3-4 days for all exercises if no increase pain               OT Education - 05/19/16 1214    Education provided Yes   Education Details HEP update   Person(s) Educated Patient   Methods Explanation;Demonstration;Tactile cues;Verbal cues   Comprehension Verbal cues required;Returned demonstration;Verbalized understanding          OT Short Term Goals - 05/13/16 1048      OT SHORT TERM GOAL #1   Title Pain on PRWHE improve with 20 points    Baseline 32/50 at eval on PRWHE- improve to 19/50   Time 3   Period Weeks   Status On-going     OT SHORT TERM GOAL #2   Title AROM in L thumb improve  for pt to do buttons, graps around empty glass   Baseline progress in ROM - but did not use it yet - need to try for next visit   Time 4   Period  Weeks   Status On-going     OT SHORT TERM GOAL #3   Title L wrist AROM improve to WNL compare to R to improve 20 points on functional use    Baseline Wrist AROM improve to same than R - but PRWHE function improve 10 points    Time 3   Period Weeks   Status On-going           OT Long Term Goals - 05/13/16 1049      OT LONG TERM GOAL #1   Title Function improve using L hand by at least 25 points    Baseline at eval on PRHWE function 43/50 - improve to 24/50   Time 4   Period Weeks   Status On-going     OT LONG TERM GOAL #2   Title Grip strength improve to 50% compare to R to carry more than 5 lbs    Baseline grip improve to 40 but still some pain - can carry 5 lbs - some pain with 8 lbs    Status Achieved     OT LONG TERM GOAL #3   Title Prehension strength improve to at least 50% compare R to carry plate, turn doorknob , cut food   Baseline improving - lat 8 lbs and 3point 5 lbs - but painfull   Time 4   Period Weeks   Status On-going               Plan - 05/19/16 1215    Clinical Impression Statement Pt made great progress since last time and in last 2 wks in grip and prehension strength - pain better and able to use hand more - and with any splint on - pt to cont to work on endurance and strength without increasing  pain    Rehab Potential Good   OT Frequency 1x / week   OT Duration 4 weeks   OT Treatment/Interventions Self-care/ADL training;Fluidtherapy;Splinting;Patient/family education;Therapeutic exercises;Contrast Bath;Scar mobilization;Passive range of motion;Parrafin;Manual Therapy   Plan assess PRWHE if function improve - and pain    OT  Home Exercise Plan see pt instruction   Consulted and Agree with Plan of Care Patient      Patient will benefit from skilled therapeutic intervention in order to improve the following deficits and impairments:  Decreased range of motion, Impaired flexibility, Decreased coordination, Increased edema, Impaired sensation,  Pain, Impaired UE functional use, Decreased scar mobility, Decreased strength, Decreased knowledge of precautions, Decreased knowledge of use of DME  Visit Diagnosis: Muscle weakness (generalized)  Pain in left hand  Stiffness of left wrist, not elsewhere classified    Problem List There are no active problems to display for this patient.   Rosalyn Gess OTR/L,CLT 05/19/2016, 12:17 PM  [place] [facility] [address] [place], [address] Phone: [phone]   Fax:  [phone]  Name: Megan York MRN: [id number] Date of Birth: 09-02-1960

## 2016-05-29 ENCOUNTER — Ambulatory Visit: Payer: 59 | Admitting: Occupational Therapy

## 2016-05-29 DIAGNOSIS — M6281 Muscle weakness (generalized): Secondary | ICD-10-CM | POA: Diagnosis not present

## 2016-05-29 DIAGNOSIS — M79642 Pain in left hand: Secondary | ICD-10-CM

## 2016-05-29 DIAGNOSIS — M25632 Stiffness of left wrist, not elsewhere classified: Secondary | ICD-10-CM

## 2016-05-29 NOTE — Therapy (Signed)
[place] [facility] [address], [place], [address] Phone: [phone]   Fax:  [phone]  Occupational Therapy Treatment  Patient Details  Name: Megan York MRN: [id number] Date of Birth: November 04, 1960 Referring Provider: Rudene Christians  Encounter Date: 05/29/2016      OT End of Session - 05/29/16 1123    Visit Number 14   Number of Visits 16   Date for OT Re-Evaluation 06/10/16   OT Start Time 1100   OT Stop Time 1157   OT Time Calculation (min) 57 min   Activity Tolerance Patient tolerated treatment well   Behavior During Therapy [facility] for tasks assessed/performed      Past Medical History:  Diagnosis Date  . Arthritis   . Cancer ([place])    thyroid  . Chronic kidney disease    h/o stones  . Diabetes mellitus without complication (HCC)    diet controlled-no meds  . Family history of adverse reaction to anesthesia    pts sister hard to wake up  . History of Roux-en-Y gastric bypass   . Hypothyroidism   . Seasonal allergies     Past Surgical History:  Procedure Laterality Date  . CARPOMETACARPAL ([place]) FUSION OF THUMB Left 02/20/2016   Procedure: [facility] ARTHROPLASTY;  Surgeon: Hessie Knows, MD;  Location: [facility] ORS;  Service: Orthopedics;  Laterality: Left;  . CHOLECYSTECTOMY    . DIAGNOSTIC LAPAROSCOPY    . GASTRIC BYPASS  2002  . ROTATOR CUFF REPAIR Bilateral   . THUMB ARTHROSCOPY    . TOTAL THYROIDECTOMY      There were no vitals filed for this visit.      Subjective Assessment - 05/29/16 1102    Subjective  I am just frustruated - my thumb hurting still - I wore my splints less than 25% - I know I compairing it to my other thumb -and I know I should not   Patient Stated Goals Want to get the use of my thumb back - ROM , strenght  better - and use L hand to grip, pull , lift , cut , open or do buttons    Currently in Pain? Yes   Pain Score 1    Pain Location Hand   Pain Orientation Left   Pain  Descriptors / Indicators Aching   Pain Type Surgical pain            [facility] OT Assessment - 05/29/16 0001      Strength   Right Hand Grip (lbs) 58   Right Hand Lateral Pinch 13 lbs   Right Hand 3 Point Pinch 15 lbs   Left Hand Grip (lbs) 45   Left Hand Lateral Pinch 9 lbs   Left Hand 3 Point Pinch 5 lbs     Left Hand AROM   L Thumb MCP 0-60 60 Degrees   L Thumb IP 0-80 70 Degrees   L Thumb Radial ADduction/ABduction 0-55 45   L Thumb Palmar ADduction/ABduction 0-45 50                   OT Treatments/Exercises (OP) - 05/29/16 0001      Ultrasound   Ultrasound Location thumb   Ultrasound Parameters 3.3MHZ, 20% ,1.0 intensity 4 min at end of session to decrease pain      LUE Fluidotherapy   Number Minutes Fluidotherapy 10 Minutes   LUE Fluidotherapy Location Hand   Comments decrease pain in thumb and tightness at thumb  Assess grip and prehension - had increase pain this date - AROM for thumb assess in all planes   grip and ROM improved  fluido therapy done longer to decrease pain  Soft tissue massage for scar and thumb webspace - including some thumb PA and RA stretches   Reviewed with pt using splint as needed if act cause pain more than 2/10 - to use neoprene CMC splint or MP hyperextention block splint   Isometric strength done pain free to thumb in all planes  Reassess prehension - increase with less pain    Pt to cont with putty at home still  teal putty- grip 10 reps  Lat and 3 point grip 12 reps  one set Mix 1/2 of teal to light blue - no splint on and do 2 sets of 12 reps -  pulling and twising - work at home on 2 x day - 2sets - and no splint  And some extention /pushing simulated into putty for 2nd and thumb  12 reps each  Work on endurance and no increase pain   [place] at end of session to decrease pain             OT Education - 05/29/16 1123    Education provided Yes   Education Details pt to use splint with painfull act     Person(s) Educated Patient   Methods Demonstration;Tactile cues;Verbal cues   Comprehension Verbal cues required;Returned demonstration;Verbalized understanding          OT Short Term Goals - 05/13/16 1048      OT SHORT TERM GOAL #1   Title Pain on PRWHE improve with 20 points    Baseline 32/50 at eval on PRWHE- improve to 19/50   Time 3   Period Weeks   Status On-going     OT SHORT TERM GOAL #2   Title AROM in L thumb improve  for pt to do buttons, graps around empty glass   Baseline progress in ROM - but did not use it yet - need to try for next visit   Time 4   Period Weeks   Status On-going     OT SHORT TERM GOAL #3   Title L wrist AROM improve to WNL compare to R to improve 20 points on functional use    Baseline Wrist AROM improve to same than R - but PRWHE function improve 10 points    Time 3   Period Weeks   Status On-going           OT Long Term Goals - 05/13/16 1049      OT LONG TERM GOAL #1   Title Function improve using L hand by at least 25 points    Baseline at eval on PRHWE function 43/50 - improve to 24/50   Time 4   Period Weeks   Status On-going     OT LONG TERM GOAL #2   Title Grip strength improve to 50% compare to R to carry more than 5 lbs    Baseline grip improve to 40 but still some pain - can carry 5 lbs - some pain with 8 lbs    Status Achieved     OT LONG TERM GOAL #3   Title Prehension strength improve to at least 50% compare R to carry plate, turn doorknob , cut food   Baseline improving - lat 8 lbs and 3point 5 lbs - but painfull   Time 4   Period Weeks  Status On-going               Plan - 05/29/16 1125    Clinical Impression Statement Pt had increase pain this date because of not using any splints - using hand more normally - pt to wear CMC neoprene splint with act causing more than 2/10 pain - cont with HEP for strengthening - prehension increase at end of session when pain decrease    Rehab Potential Good   OT  Frequency 1x / week   OT Duration 4 weeks   OT Treatment/Interventions Self-care/ADL training;Fluidtherapy;Splinting;Patient/family education;Therapeutic exercises;Contrast Bath;Scar mobilization;Passive range of motion;Parrafin;Manual Therapy   Plan PRWHE and possible discharge   OT Home Exercise Plan see pt instruction   Consulted and Agree with Plan of Care Patient      Patient will benefit from skilled therapeutic intervention in order to improve the following deficits and impairments:  Decreased range of motion, Impaired flexibility, Decreased coordination, Increased edema, Impaired sensation, Pain, Impaired UE functional use, Decreased scar mobility, Decreased strength, Decreased knowledge of precautions, Decreased knowledge of use of DME  Visit Diagnosis: Muscle weakness (generalized)  Pain in left hand  Stiffness of left wrist, not elsewhere classified    Problem List There are no active problems to display for this patient.   Rosalyn Gess OTR/L, CLT 05/29/2016, 12:19 PM   [place] [facility] [address], [place], [address] Phone: [phone]   Fax:  [phone]  Name: FANIA ROOSE MRN: [id number] Date of Birth: 10-20-1960

## 2016-05-29 NOTE — Patient Instructions (Addendum)
  Reviewed with pt using splint as needed if act cause pain more than 2/10 - to use neoprene CMC splint or MP hyperextention block splint    Pt to cont with putty at home still  teal putty- grip 10 reps  Lat and 3 point grip 12 reps  one set Mix 1/2 of teal to light blue - no splint on and do 2 sets of 12 reps -  pulling and twising - work at home on 2 x day - 2sets - and no splint  And some extention /pushing simulated into putty for 2nd and thumb  12 reps each  Work on endurance and no increase pain

## 2016-06-05 ENCOUNTER — Ambulatory Visit: Payer: 59 | Admitting: Occupational Therapy

## 2018-01-01 IMAGING — MR MR SHOULDER*L* W/O CM
5 series · 40 of 40 positions shown · non-contrast
Comparison: None.

CLINICAL DATA: Shoulder surgery 2229. No known injury. Left
shoulder pain.

EXAM:
MRI OF THE LEFT SHOULDER WITHOUT CONTRAST
TECHNIQUE: Multiplanar, multisequence MR imaging of the shoulder was performed.
No intravenous contrast was administered.

[Series 3: T2 fat-sat · axial · 4.0mm · 0.47mm/px · z∈[-39,+50]mm · 8 of 22 slices shown (1 of 3)]
[im 1/22]
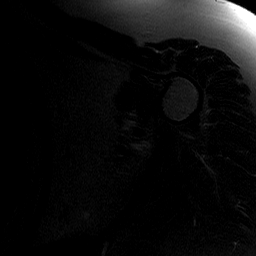
[im 4/22]
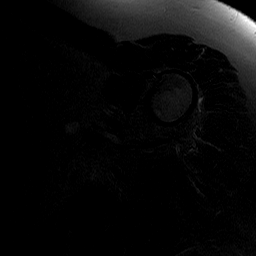
[im 7/22]
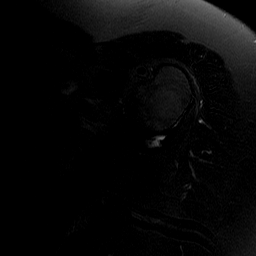
[im 10/22]
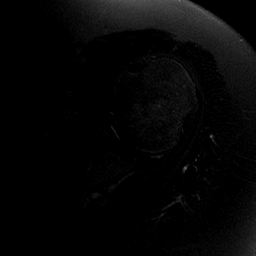
[im 13/22]
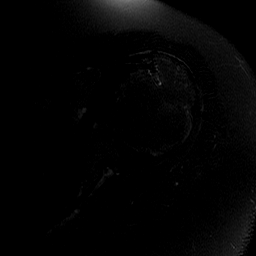
[im 16/22]
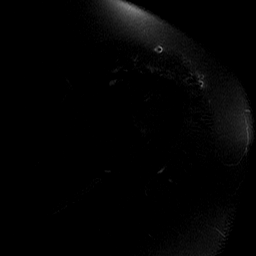
[im 19/22]
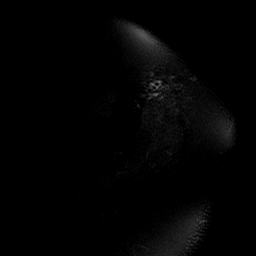
[im 22/22]
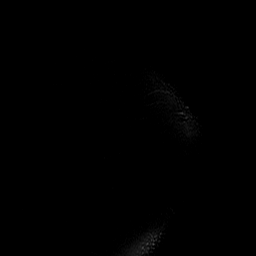

[Series 4: T1 · oblique · 4.0mm · 0.62mm/px · 9 of 21 slices shown]
[im 1/21]
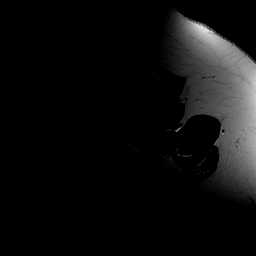
[im 3/21]
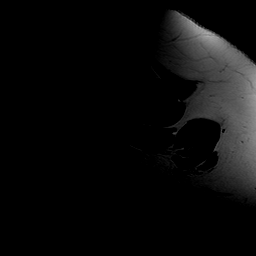
[im 6/21]
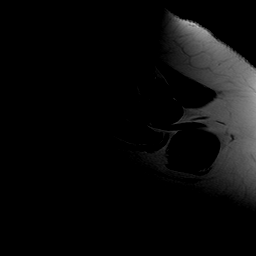
[im 8/21]
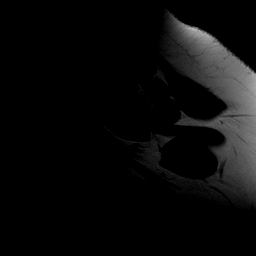
[im 11/21]
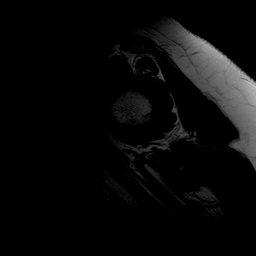
[im 13/21]
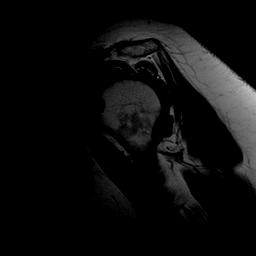
[im 16/21]
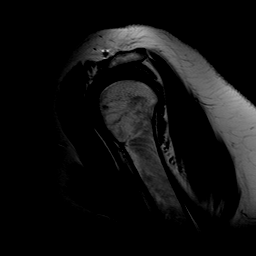
[im 18/21]
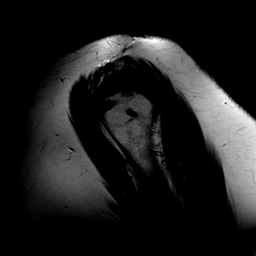
[im 21/21]
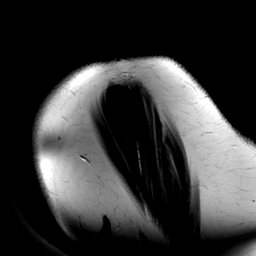

[Series 5: T2 fat-sat · oblique · 4.0mm · 0.62mm/px · 9 of 21 slices shown (2 of 3)]
[im 1/21]
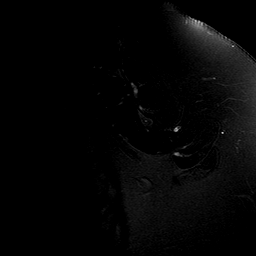
[im 3/21]
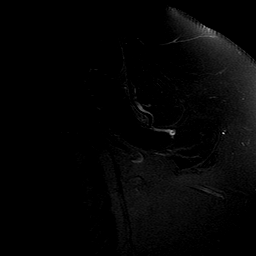
[im 6/21]
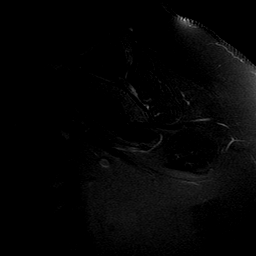
[im 8/21]
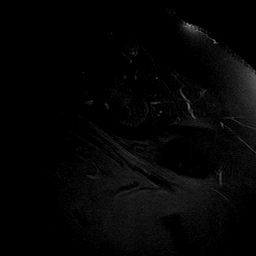
[im 11/21]
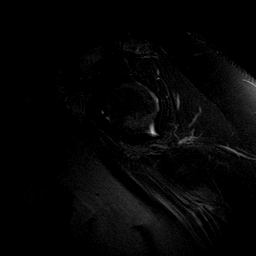
[im 13/21]
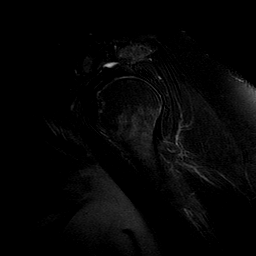
[im 16/21]
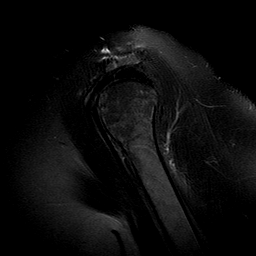
[im 18/21]
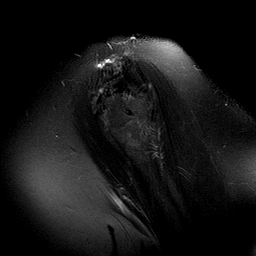
[im 21/21]
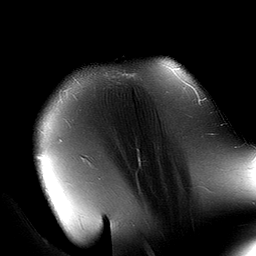

[Series 6: T2 fat-sat · sagittal · 4.0mm · 0.62mm/px · 7 of 16 slices shown (3 of 3)]
[im 1/16]
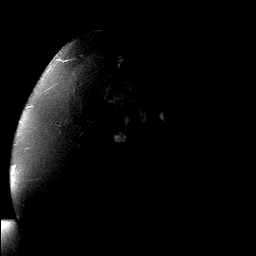
[im 3/16]
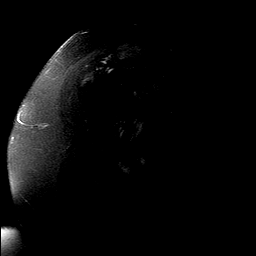
[im 6/16]
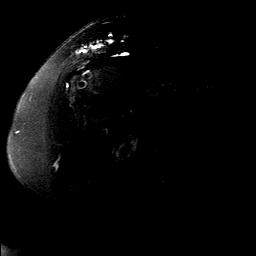
[im 8/16]
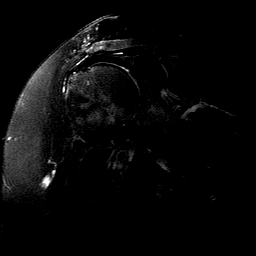
[im 11/16]
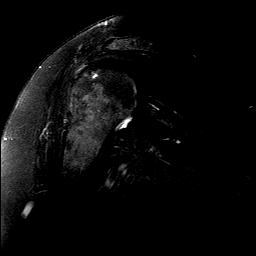
[im 13/16]
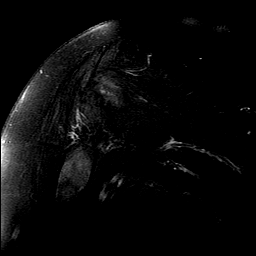
[im 16/16]
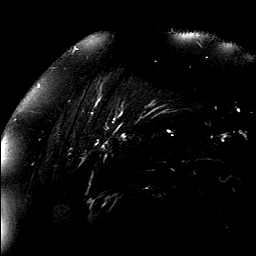

[Series 7: PD · sagittal · 4.0mm · 0.62mm/px · 7 of 16 slices shown]
[im 1/16]
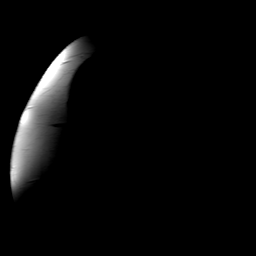
[im 3/16]
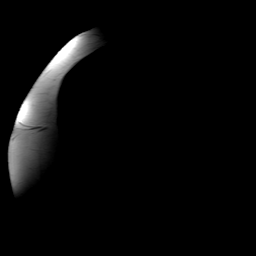
[im 6/16]
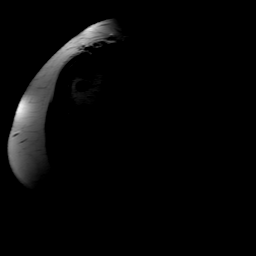
[im 8/16]
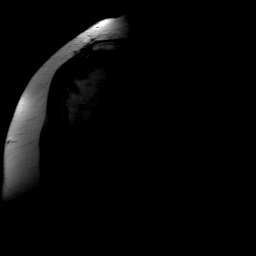
[im 11/16]
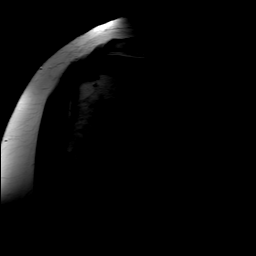
[im 13/16]
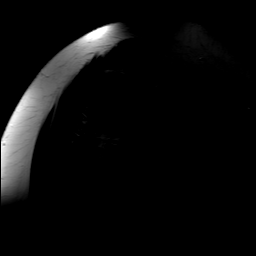
[im 16/16]
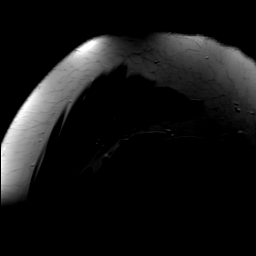

[40 of 40 positions shown; findings below may reference images not displayed]

FINDINGS: Rotator cuff: Mild tendinosis of the supraspinatus tendon with
fraying along the bursal surface. Mild tendinosis of the
infraspinatus tendon without a tear. Teres minor tendon is intact.
Subscapularis tendon is intact.

Muscles: No atrophy or fatty replacement of nor abnormal signal
within, the muscles of the rotator cuff.

Biceps long head:  Intact.

Acromioclavicular Joint: Prior subacromial decompression. Type I
acromion. No subacromial/ subdeltoid bursal fluid.

Glenohumeral Joint: No joint effusion.  No chondral defect.

Labrum: Grossly intact, but evaluation is limited by lack of
intraarticular fluid.

Bones:  No marrow signal abnormality.  No fracture or dislocation.

Other: None
IMPRESSION: 1. Mild tendinosis of the supraspinatus tendon with fraying along
the bursal surface. Mild tendinosis of the infraspinatus tendon
without a tear. No recurrent rotator cuff tear.
